# Patient Record
Sex: Female | Born: 1975 | Race: Black or African American | Hispanic: No | Marital: Married | Smoking: Never smoker
Health system: Southern US, Community
[De-identification: ages and names within clinical notes are randomized; demographics above are authoritative.]

## PROBLEM LIST (undated history)

## (undated) DIAGNOSIS — IMO0002 Reserved for concepts with insufficient information to code with codable children: Secondary | ICD-10-CM

## (undated) DIAGNOSIS — J45909 Unspecified asthma, uncomplicated: Secondary | ICD-10-CM

## (undated) DIAGNOSIS — R51 Headache: Secondary | ICD-10-CM

## (undated) HISTORY — DX: Reserved for concepts with insufficient information to code with codable children: IMO0002

## (undated) HISTORY — DX: Unspecified asthma, uncomplicated: J45.909

---

## 2000-06-20 ENCOUNTER — Other Ambulatory Visit: Admission: RE | Admit: 2000-06-20 | Discharge: 2000-06-20 | Payer: Self-pay | Admitting: Obstetrics

## 2000-06-25 ENCOUNTER — Inpatient Hospital Stay (HOSPITAL_COMMUNITY): Admission: AD | Admit: 2000-06-25 | Discharge: 2000-06-25 | Payer: Self-pay | Admitting: Obstetrics

## 2000-06-25 ENCOUNTER — Encounter: Payer: Self-pay | Admitting: Obstetrics

## 2000-12-08 ENCOUNTER — Inpatient Hospital Stay (HOSPITAL_COMMUNITY): Admission: AD | Admit: 2000-12-08 | Discharge: 2000-12-10 | Payer: Self-pay | Admitting: Obstetrics

## 2002-01-31 ENCOUNTER — Inpatient Hospital Stay (HOSPITAL_COMMUNITY): Admission: AD | Admit: 2002-01-31 | Discharge: 2002-02-02 | Payer: Self-pay | Admitting: Obstetrics

## 2013-02-10 ENCOUNTER — Ambulatory Visit (INDEPENDENT_AMBULATORY_CARE_PROVIDER_SITE_OTHER): Payer: BC Managed Care – HMO | Admitting: Physician Assistant

## 2013-02-10 VITALS — BP 126/78 | HR 67 | Temp 97.7°F | Resp 16 | Ht 65.0 in | Wt 188.0 lb

## 2013-02-10 DIAGNOSIS — R109 Unspecified abdominal pain: Secondary | ICD-10-CM

## 2013-02-10 DIAGNOSIS — N926 Irregular menstruation, unspecified: Secondary | ICD-10-CM

## 2013-02-10 LAB — POCT CBC
Granulocyte percent: 47.1 %G (ref 37–80)
HCT, POC: 36.1 % — AB (ref 37.7–47.9)
Hemoglobin: 11 g/dL — AB (ref 12.2–16.2)
Lymph, poc: 3.2 (ref 0.6–3.4)
MCH, POC: 24.2 pg — AB (ref 27–31.2)
MCHC: 30.5 g/dL — AB (ref 31.8–35.4)
MCV: 79.3 fL — AB (ref 80–97)
MID (cbc): 0.6 (ref 0–0.9)
MPV: 9.3 fL (ref 0–99.8)
POC Granulocyte: 3.4 (ref 2–6.9)
POC LYMPH PERCENT: 44.6 %L (ref 10–50)
POC MID %: 8.3 %M (ref 0–12)
Platelet Count, POC: 219 10*3/uL (ref 142–424)
RBC: 4.55 M/uL (ref 4.04–5.48)
RDW, POC: 17.5 %
WBC: 7.2 10*3/uL (ref 4.6–10.2)

## 2013-02-10 LAB — POCT UA - MICROSCOPIC ONLY
Casts, Ur, LPF, POC: NEGATIVE
Crystals, Ur, HPF, POC: NEGATIVE
Yeast, UA: NEGATIVE

## 2013-02-10 LAB — POCT URINALYSIS DIPSTICK
Bilirubin, UA: NEGATIVE
Glucose, UA: NEGATIVE
Ketones, UA: NEGATIVE
Leukocytes, UA: NEGATIVE
Nitrite, UA: NEGATIVE
Protein, UA: NEGATIVE
Spec Grav, UA: >=1.03
Urobilinogen, UA: 0.2
pH, UA: 5

## 2013-02-10 LAB — POCT URINE PREGNANCY: Preg Test, Ur: NEGATIVE

## 2013-02-10 NOTE — Progress Notes (Signed)
  Subjective:    Patient ID: Anna Hudson, female    DOB: [birth year], 37 y.o.   MRN: [id number]  HPI 37 year old female presents today with her husband who is translating for her.  She complains of lower abdominal pain x 2 years. States symptoms started after having surgery after a miscarriage.  Admits pain is cyclical and typically comes after her menstrual cycle.  Has taken OTC ibuprofen which does seem to help.  Does believe that the pain is worse this month and that is why she presented today.  LNMP 01/30/13. Denies menorrhagia.  No dysuria, frequency, diarrhea, constipation, hematuria, hematochezia, melena, nausea, vomiting, fever, or chills.  She would like to become pregnant but is afraid due to this pain.      Review of Systems  Constitutional: Negative for fever and chills.  Gastrointestinal: Positive for abdominal pain. Negative for nausea, vomiting, diarrhea, constipation and blood in stool.  Genitourinary: Negative for dysuria, frequency, vaginal bleeding, vaginal discharge and vaginal pain.  Neurological: Negative for dizziness and headaches.       Objective:   Physical Exam  Constitutional: She is oriented to person, place, and time. She appears well-developed and well-nourished.  HENT:  Head: Normocephalic and atraumatic.  Right Ear: External ear normal.  Left Ear: External ear normal.  Eyes: Conjunctivae are normal.  Neck: Normal range of motion.  Cardiovascular: Normal rate, regular rhythm and normal heart sounds.   Pulmonary/Chest: Effort normal and breath sounds normal.  Abdominal: There is no CVA tenderness.    Neurological: She is alert and oriented to person, place, and time.  Psychiatric: She has a normal mood and affect. Her behavior is normal. Judgment and thought content normal.          Assessment & Plan:  Irregular menstrual cycle - Plan: POCT urine pregnancy, Ambulatory referral to Obstetrics / Gynecology  Abdominal  pain, other specified site  - Plan: POCT CBC, POCT urinalysis dipstick, POCT UA - Microscopic Only, Ambulatory referral to Obstetrics / Gynecology  Referral to gynecology for further evaluation and treatment In the meantime, take OTC ibuprofen prn pain as this does seem to help.  Due to cyclical nature of this pain, I do believe that is is likely dysmenorrhea, however further evaluation with [place] is warranted

## 2013-03-26 ENCOUNTER — Ambulatory Visit (INDEPENDENT_AMBULATORY_CARE_PROVIDER_SITE_OTHER): Payer: BC Managed Care – PPO | Admitting: Obstetrics & Gynecology

## 2013-03-26 ENCOUNTER — Encounter: Payer: Self-pay | Admitting: Obstetrics & Gynecology

## 2013-03-26 VITALS — BP 121/85 | HR 76 | Temp 98.0°F | Ht 65.0 in | Wt 192.3 lb

## 2013-03-26 DIAGNOSIS — N979 Female infertility, unspecified: Secondary | ICD-10-CM

## 2013-03-26 DIAGNOSIS — N949 Unspecified condition associated with female genital organs and menstrual cycle: Secondary | ICD-10-CM

## 2013-03-26 DIAGNOSIS — N926 Irregular menstruation, unspecified: Secondary | ICD-10-CM

## 2013-03-26 DIAGNOSIS — G8929 Other chronic pain: Secondary | ICD-10-CM

## 2013-03-26 LAB — CBC
HCT: 35 % — ABNORMAL LOW (ref 36.0–46.0)
Hemoglobin: 11.5 g/dL — ABNORMAL LOW (ref 12.0–15.0)
MCH: 24.4 pg — ABNORMAL LOW (ref 26.0–34.0)
MCHC: 32.9 g/dL (ref 30.0–36.0)
MCV: 74.2 fL — ABNORMAL LOW (ref 78.0–100.0)

## 2013-03-26 MED ORDER — DICLOFENAC SODIUM 75 MG PO TBEC: 75.0000 mg | DELAYED_RELEASE_TABLET | Freq: Two times a day (BID) | ORAL | Status: DC | PRN

## 2013-03-26 NOTE — Patient Instructions (Signed)

## 2013-03-26 NOTE — Progress Notes (Signed)
GYNECOLOGY CLINIC PROGRESS NOTE  History:  37 y.o. [id number] here today for evaluation of irregular menses.  She is from [place] and [place], declines the use of a medical interpreter as she wants her husband to interpret for her.  Reports irregular menses for past three months; period length is now 2- 8 days as opposed to 5 days.  No intermenstrual bleeding.  Reports having heavier bleeding than usual in the first few days of her cycle, uses 5 pads/day for these days.  Also reports chronic pelvic pain; this pain has been ongoing since her cesarean section in 2012 for a 6 month IUFD.  Pain is episodic but has gotten worse in the last three months with her irregular menstrual periods.  Patient is also worried about secondary infertility.   The following portions of the patient's history were reviewed and updated as appropriate: allergies, current medications, past family history, past medical history, past social history, past surgical history and problem list.  Review of Systems:  Pertinent items are noted in HPI.  Objective:  Physical Exam BP 121/85  Pulse 76  Temp(Src) 98 F (36.7 C)  Ht 5\' 5"  (1.651 m)  Wt 192 lb 4.8 oz (87.227 kg)  BMI 32 kg/m2  LMP 03/25/2013 Deferred as per patient request  Assessment & Plan:   Will check labs for evaluation of irregular menses, will also order pelvic ultrasound; will follow up results and manage accordingly. Patient needs to sign the refusal of interpreter form when she comes back to discuss results.

## 2013-03-27 ENCOUNTER — Telehealth: Payer: Self-pay | Admitting: *Deleted

## 2013-03-27 LAB — PROLACTIN: Prolactin: 7.7 ng/mL

## 2013-03-27 LAB — FOLLICLE STIMULATING HORMONE: FSH: 5.4 m[IU]/mL

## 2013-03-27 LAB — HCG, QUANTITATIVE, PREGNANCY: hCG, Beta Chain, Quant, S: <2 m[IU]/mL

## 2013-03-27 NOTE — Telephone Encounter (Signed)
Message copied by Jill Side on Tue Mar 27, 2013  3:49 PM ------      Message from: Jaynie Collins A      Created: Tue Mar 27, 2013  8:29 AM       Normal labs, patient is not anemic.  Will folllow up ultrasound.  Please call to inform patient of results.       ------

## 2013-03-30 ENCOUNTER — Ambulatory Visit (HOSPITAL_COMMUNITY)
Admission: RE | Admit: 2013-03-30 | Discharge: 2013-03-30 | Disposition: A | Discharge status: Home / Self Care | Payer: BC Managed Care – PPO | Source: Ambulatory Visit | Attending: Obstetrics & Gynecology | Admitting: Obstetrics & Gynecology

## 2013-03-30 DIAGNOSIS — N949 Unspecified condition associated with female genital organs and menstrual cycle: Secondary | ICD-10-CM | POA: Insufficient documentation

## 2013-03-30 DIAGNOSIS — N926 Irregular menstruation, unspecified: Secondary | ICD-10-CM | POA: Insufficient documentation

## 2013-04-03 NOTE — Telephone Encounter (Signed)
Called pt with [employer] # [phone] and left message that her labs are normal and if she has any questions to please give [place] a call here at the clinics.

## 2013-04-26 ENCOUNTER — Telehealth: Payer: Self-pay | Admitting: *Deleted

## 2013-04-26 NOTE — Telephone Encounter (Signed)
Speaks [place] per chart.

## 2013-04-26 NOTE — Telephone Encounter (Signed)
Received a voice mail from a female stating [place] wanting to get ultrasound results from last month.

## 2013-05-01 NOTE — Telephone Encounter (Signed)
Called patient with [employer] [phone], no answer- left message that we are returning her phone call and to call us back at the clinics. Patient needs to follow up with Dr Macon Large. Has appt for 9/24 @ 12:45- patient needs to be informed

## 2013-05-02 ENCOUNTER — Telehealth: Payer: Self-pay | Admitting: *Deleted

## 2013-05-02 NOTE — Telephone Encounter (Signed)
Received message from a female caller that he is calling for pt's test results. He stated to call back at 1200 or 1230 and no interpreter is needed.

## 2013-05-02 NOTE — Telephone Encounter (Signed)
Erroneous encounter

## 2013-05-04 ENCOUNTER — Telehealth: Payer: Self-pay | Admitting: General Practice

## 2013-05-04 NOTE — Telephone Encounter (Signed)
Patient called and left message stating she wanted results and to call back.

## 2013-05-09 NOTE — Telephone Encounter (Signed)
See other call from this patient in same time period.

## 2013-05-09 NOTE — Telephone Encounter (Signed)
Called only phone number listed with [employer] and left a message we are returning your call- please call back if you still have questions.  Also left message stating you do have an appointment 06/06/13 .

## 2013-06-06 ENCOUNTER — Ambulatory Visit (INDEPENDENT_AMBULATORY_CARE_PROVIDER_SITE_OTHER): Payer: BC Managed Care – PPO | Admitting: Obstetrics & Gynecology

## 2013-06-06 VITALS — BP 117/79 | HR 62 | Temp 99.0°F | Wt 195.0 lb

## 2013-06-06 DIAGNOSIS — G8929 Other chronic pain: Secondary | ICD-10-CM

## 2013-06-06 DIAGNOSIS — N949 Unspecified condition associated with female genital organs and menstrual cycle: Secondary | ICD-10-CM

## 2013-06-06 DIAGNOSIS — N979 Female infertility, unspecified: Secondary | ICD-10-CM

## 2013-06-06 MED ORDER — DICLOFENAC SODIUM 75 MG PO TBEC: 75.0000 mg | DELAYED_RELEASE_TABLET | Freq: Two times a day (BID) | ORAL | Status: DC | PRN

## 2013-06-06 MED ORDER — CLOMIPHENE CITRATE 50 MG PO TABS: 50.0000 mg | ORAL_TABLET | Freq: Every day | ORAL | Status: DC

## 2013-06-06 NOTE — Patient Instructions (Addendum)
CLOMID PATIENT INSTRUCTIONS  WHY USE IT? Clomid helps your ovaries to release eggs (ovulate).  HOW TO USE IT? Clomid is taken as a pill usually on days 1, 2, 3, 4, and 5 of your cycle.  Day 1 is the first day of your period. The dose or duration may be changed to achieve ovulation.  The day of ovulation on Clomid is usually between cycle day 14 and 17.  Having sexual intercourse at least every other day between cycle day 13 and 18 will improve your chances of becoming pregnant during the Clomid cycle.  You may monitor your ovulation using basal body temperature charts or with ovulation kits.  If using the ovulation predictor kits, having intercourse the day of the surge and the two days following is recommended. If you get your period, call when it starts for an appointment with your doctor, so that an exam may be done, and another Clomid cycle can be considered if appropriate. If you do not get a period by day 35 of the cycle, please get a blood pregnancy test.  If it is negative, speak to your doctor for instructions to bring on another period and to plan a follow-up appointment.  THINGS TO KNOW: If you get pregnant while using Clomid, your chance of twins is 7% and triplets is less than 1%. Some studies have suggested the use of "fertility drugs" may increase your risk of ovarian cancers in the future.  It is unclear if these drugs increase the risk, or people who have problems with fertility are prone for these cancers.  If there is an actual risk, it is very low.  If you have a history of liver problems or ovarian cancer, it may be wise to avoid this medication.  SIDE EFFECTS:  The most common side effect is hot flashes (20%).  Breast tenderness, headaches, nausea, bloating may also occur at different times.  Less than 3/1,000 people have dryness or loss of hair.  Persistent ovarian cysts may form from the use of this medication.  Ovarian hyperstimulation syndrome is a rare side effect at  low doses.  Visual changes like flashes of light or blurring.

## 2013-06-07 ENCOUNTER — Encounter: Payer: Self-pay | Admitting: Obstetrics & Gynecology

## 2013-06-07 ENCOUNTER — Encounter: Payer: Self-pay | Admitting: *Deleted

## 2013-06-07 NOTE — Progress Notes (Signed)
GYNECOLOGY CLINIC ENCOUNTER NOTE  History:  37 y.o. [id number] here today for discussion of results after evaluation for abnormal menses and secondary infertility.  Her husband is her interpreter, she signed a form declining any other interpreter. She also reports having pain around her cesarean incision site.  No other concerns.  The following portions of the patient's history were reviewed and updated as appropriate: allergies, current medications, past family history, past medical history, past social history, past surgical history and problem list.  Review of Systems:  Pertinent items are noted in HPI.  Objective:  Physical Exam BP 117/79  Pulse 62  Temp(Src) 99 F (37.2 C) (Oral)  Wt 195 lb (88.451 kg)  BMI 32.45 kg/m2 Gen: NAD Abd: Soft, nondistended, nontender except for a 1 cm tendersubcutaneous mass palpated in the mid right section of the incision.   Pelvic: Deferred  Labs and Imaging Recent Results (from the past 2160 hour(s))  PROLACTIN     Status: None   Collection Time    03/26/13  1:59 PM      Result Value Range   Prolactin 7.7     Comment:      Reference Ranges:                      Female:                       2.1 -  17.1 ng/ml                      Female:   Pregnant          9.7 - 208.5 ng/mL                                Non Pregnant      2.8 -  29.2 ng/mL                                Post Menopausal   1.8 -  20.3 ng/mL                         TESTOSTERONE, FREE, TOTAL     Status: Abnormal   Collection Time    03/26/13  1:59 PM      Result Value Range   Testosterone 66  10 - 70 ng/dL   Comment:           Tanner Stage       Female              Female                   I              < 30 ng/dL        < 10 ng/dL                   II             < 150 ng/dL       < 30 ng/dL                   III            100-320 ng/dL     < 35 ng/dL  IV             200-970 ng/dL     16-10 ng/dL                   V/Adult        300-890 ng/dL     96-04 ng/dL          Sex Hormone Binding 47  18 - 114 nmol/L   Testosterone, Free 9.6 (*) 0.6 - 6.8 pg/mL   Comment:       The concentration of free testosterone is derived from a mathematical     expression based on constants for the binding of testosterone to sex     hormone-binding globulin and albumin.   Testosterone-% Freee. 1.4  0.4 - 2.4 %  TSH     Status: None   Collection Time    03/26/13  1:59 PM      Result Value Range   TSH 1.369  0.350 - 4.500 uIU/mL  FOLLICLE STIMULATING HORMONE     Status: None   Collection Time    03/26/13  1:59 PM      Result Value Range   FSH 5.4     Comment: Reference Ranges:              Female:                         1.4 -  18.1 mIU/mL              Female:   Follicular Phase    2.5 -  10.2 mIU/mL                        MidCycle Peak       3.4 -  33.4 mIU/mL                        Luteal Phase        1.5 -   9.1 mIU/mL                        Post Menopausal    23.0 - 116.3 mIU/mL                        Pregnant                <   0.3 mIU/mL  HCG, QUANTITATIVE, PREGNANCY     Status: None   Collection Time    03/26/13  1:59 PM      Result Value Range   hCG, Beta Chain, Quant, S <2.0     Comment:        Males and non-pregnant females       < 5   mIU/mL      Gestation Age               Concentration (mIU/mL)        <= 1 Week                       5 - 50           2 Weeks                     50 - 500           3 Weeks  100 - 10,000           4 Weeks                  1,000 - 30,000           5 Weeks                  3,500 - 115,000         6-8 Weeks                 12,000 - 270,000          12 Weeks                 15,000 - 220,000  CBC     Status: Abnormal   Collection Time    03/26/13  1:59 PM      Result Value Range   WBC 5.1  4.0 - 10.5 K/uL   RBC 4.72  3.87 - 5.11 MIL/uL   Hemoglobin 11.5 (*) 12.0 - 15.0 g/dL   HCT 40.9 (*) 81.1 - 91.4 %   MCV 74.2 (*) 78.0 - 100.0 fL   MCH 24.4 (*) 26.0 - 34.0 pg   MCHC 32.9  30.0 - 36.0 g/dL    RDW 78.2 (*) 95.6 - 15.5 %   Platelets 333  150 - 400 K/uL    03/30/2013   TRANSABDOMINAL AND TRANSVAGINAL ULTRASOUND OF PELVIS Clinical Data: Irregular menses.  Prior C-section.  LMP 03/21/2013   Technique:  Both transabdominal and transvaginal ultrasound examinations of the pelvis were performed. Transabdominal technique was performed for global imaging of the pelvis including uterus, ovaries, adnexal regions, and pelvic cul-de-sac.  It was necessary to proceed with endovaginal exam following the transabdominal exam to visualize the myometrium, endometrium and adnexa.  Comparison:  None  Findings:  Uterus: Is anteverted and anteflexed and demonstrates a sagittal length of 10.5 cm, depth of 5.6 cm and width of 6.9 cm.  No focal mural abnormality is seen.  Endometrium: Tri-layered pattern with a width of 12 mm.  This would correlate with a periovulatory endometrium and correspond with the provided LMP of 03/21/2013.  No areas of focal thickening or heterogeneity are seen  Right ovary:  Has a normal appearance measuring 2.3 by 2.9 x 1.6 cm  Left ovary: Measures 3.2 x 2.5 x 3.3 cm and contains a dominant follicle  Other findings: A trace of simple free fluid is noted in the cul-de- sac.  The patient reported an area of focal tenderness in the region of the C-section scar and scanning in the subcutaneous tissue over the location of tenderness reveals a hypoechoic irregular soft tissue mass measuring 1.1 x 1.1 x 1.2 cm.  This demonstrates no significant intralesional flow with color Doppler exam.  IMPRESSION: Normal periovulatory uterine myometrium, endometrium and ovaries.  Irregular hypoechoic soft tissue mass corresponding with the patient's area of tenderness in the region of the C-section scar. The appearance raises the possibility of endometriosis implant within the C-section scar especially in light of the history of associated pain.  A desmoid tumor or area of focal scarring could have a similar  appearance.  This area would be amendable to percutaneous biopsy.   Original Report Authenticated By: Rhodia Albright, M.D.    Assessment & Plan:  Results reviewed with patient.  Labs are remarkable for slightly increased free testosterone, but patient has monthly periods that are only "irregular" because the length of the period went from 5-7  days to three days.  She still has normal intervals between cycles, no intermenstrual bleeding.  No other signs of hyperandrogenism.  Patient was told that she could benefit from [facility] referral for further evaluation of her infertility; she needs HSG, husband needs semen analysis etc. Patient does not want to do this for now and is interested in taking medication to help her "get pregnant", she was referring to Clomid.  The risks of Clomid including ovarian hyperstimulation with possible risk of ovarian cancer as well as multiple gestation were discussed with patient.  She and her husband understand these risks and wanted the medication; she was prescribed Clomid 50 mcg and advised to take it during days 1-5 of her cycle.  Patient was also advised to continue frequent intercourse especially around Day 14 of her upcoming cycle (qod intercourse around days 9 - 18).  By Day 35, patient was told to call if she had her period.  If patient has bleeding at end of cycle, will try one cycle with Clomid 100 mcg but if no pregnancy occurs, she will need REI referral.  She declines outright REI referral for now.   However if patient does not have bleeding, she was told to do a pregnancy test/come in for evaluation.  Patient instructions and information was given to her.  As for her incisional pain, she was offered an injection of lidocaine into the mass area to see if this helps.  She declines this, also declines biopsy/mass resection for now.  Will continue to monitor. Diclofenac reordered for her to use prn pain.  Total encounter time: 25 minutes  Jaynie Collins, MD,  FACOG Attending Obstetrician & Gynecologist Faculty Practice, [facility]

## 2013-07-23 ENCOUNTER — Ambulatory Visit (INDEPENDENT_AMBULATORY_CARE_PROVIDER_SITE_OTHER): Payer: BC Managed Care – HMO | Admitting: Emergency Medicine

## 2013-07-23 VITALS — BP 114/80 | HR 85 | Temp 98.2°F | Resp 16 | Ht 65.25 in | Wt 199.4 lb

## 2013-07-23 DIAGNOSIS — R109 Unspecified abdominal pain: Secondary | ICD-10-CM

## 2013-07-23 DIAGNOSIS — N939 Abnormal uterine and vaginal bleeding, unspecified: Secondary | ICD-10-CM

## 2013-07-23 DIAGNOSIS — R9389 Abnormal findings on diagnostic imaging of other specified body structures: Secondary | ICD-10-CM

## 2013-07-23 DIAGNOSIS — R1030 Lower abdominal pain, unspecified: Secondary | ICD-10-CM

## 2013-07-23 DIAGNOSIS — N898 Other specified noninflammatory disorders of vagina: Secondary | ICD-10-CM

## 2013-07-23 LAB — POCT CBC
Granulocyte percent: 54 %G (ref 37–80)
HCT, POC: 40.4 % (ref 37.7–47.9)
Hemoglobin: 12.3 g/dL (ref 12.2–16.2)
Lymph, poc: 2.3 (ref 0.6–3.4)
MCH, POC: 24.8 pg — AB (ref 27–31.2)
MCHC: 30.4 g/dL — AB (ref 31.8–35.4)
MCV: 81.6 fL (ref 80–97)
MPV: 9.9 fL (ref 0–99.8)
POC Granulocyte: 3.2 (ref 2–6.9)
POC LYMPH PERCENT: 38.6 %L (ref 10–50)
POC MID %: 7.4 %M (ref 0–12)
RDW, POC: 16.4 %
WBC: 6 10*3/uL (ref 4.6–10.2)

## 2013-07-23 LAB — POCT URINALYSIS DIPSTICK
Bilirubin, UA: NEGATIVE
Glucose, UA: NEGATIVE
Leukocytes, UA: NEGATIVE
Nitrite, UA: NEGATIVE
Urobilinogen, UA: 0.2

## 2013-07-23 LAB — POCT UA - MICROSCOPIC ONLY
Casts, Ur, LPF, POC: NEGATIVE
Mucus, UA: POSITIVE
WBC, Ur, HPF, POC: NEGATIVE
Yeast, UA: NEGATIVE

## 2013-07-23 LAB — POCT URINE PREGNANCY: Preg Test, Ur: NEGATIVE

## 2013-07-23 MED ORDER — MELOXICAM 7.5 MG PO TABS: 7.5000 mg | ORAL_TABLET | Freq: Every day | ORAL | Status: DC

## 2013-07-23 NOTE — Progress Notes (Addendum)
Subjective:    Patient ID: Anna Hudson, female    DOB: November 21, 1975, 37 y.o.   MRN: [id number] This chart was scribed for Collene Gobble, MD by Valera Castle, ED Scribe. This patient was seen in room 9 and the patient's care was started at 11:34 AM.  HPI Anna Hudson is a 37 y.o. female who presents to the [facility] complaining of sudden, moderate abdominal pain, onset yesterday, with an associated irregular period, noticing dark blood. She reports her LNMP was 2 months ago in 05/2013. She reports a h/o pregnancy with her last in 2012, with a c-section. She states she has 5 children. She reports a miscarriage in 2012.   She denies having a UA at her last visit at [facility]. She states she was last seen at [facility]. She had an US done in 03/2013.  She denies any other complaints.    Patient Active Problem List   Diagnosis Date Noted  . Irregular menstrual bleeding 03/26/2013  . 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 Asthma   . Ulcer    History reviewed. No pertinent past surgical history. No Known Allergies Prior to Admission medications   Medication Sig Start Date End Date Taking? Authorizing Provider  acetaminophen (TYLENOL) 325 MG tablet Take 650 mg by mouth every 6 (six) hours as needed for pain.   Yes Historical Provider, MD  clomiPHENE (CLOMID) 50 MG tablet Take 1 tablet (50 mg total) by mouth daily. Take on days 1-5 of your period 06/06/13  Yes Tereso Newcomer, MD  diclofenac (VOLTAREN) 75 MG EC tablet Take 1 tablet (75 mg total) by mouth 2 (two) times daily as needed. 06/06/13  Yes Tereso Newcomer, MD    Review of Systems  Gastrointestinal: Positive for abdominal pain.  Genitourinary: Positive for menstrual problem (dark blood, irregular period).      Objective:   Physical Exam Nursing note and vitals reviewed. Constitutional: She is oriented to person, place, and time. She  appears well-developed and well-nourished. No distress.  HENT:  Head: Normocephalic and atraumatic.  Eyes: EOM are normal. Pupils are equal, round, and reactive to light.  Neck: Neck supple. No tracheal deviation present.  Cardiovascular: Normal rate, regular rhythm and normal heart sounds.  Exam reveals no gallop and no friction rub.   No murmur heard. Pulmonary/Chest: Effort normal and breath sounds normal. No respiratory distress. She has no wheezes. She has no rales.  Abdominal: Soft. Bowel sounds are normal. There is no tenderness. There is no rebound and no guarding.  Musculoskeletal: Normal range of motion.  Neurological: She is alert and oriented to person, place, and time.  Skin: Skin is warm and dry.  Psychiatric: She has a normal mood and affect. Her behavior is normal.  Results for orders placed in visit on 07/23/13  POCT URINE PREGNANCY      Result Value Range   Preg Test, Ur Negative    POCT CBC      Result Value Range   WBC 6.0  4.6 - 10.2 Hudson/uL   Lymph, poc 2.3  0.6 - 3.4   POC LYMPH PERCENT 38.6  10 - 50 %L   MID (cbc) 0.4  0 - 0.9   POC MID % 7.4  0 - 12 %M   POC Granulocyte 3.2  2 - 6.9   Granulocyte percent 54.0  37 - 80 %G   RBC  4.95  4.04 - 5.48 M/uL   Hemoglobin 12.3  12.2 - 16.2 g/dL   HCT, POC 53.6  64.4 - 47.9 %   MCV 81.6  80 - 97 fL   MCH, POC 24.8 (*) 27 - 31.2 pg   MCHC 30.4 (*) 31.8 - 35.4 g/dL   RDW, POC 03.4     Platelet Count, POC 218.0  142 - 424 Hudson/uL   MPV 9.9  0 - 99.8 fL   Results for orders placed in visit on 07/23/13  POCT URINE PREGNANCY      Result Value Range   Preg Test, Ur Negative    POCT CBC      Result Value Range   WBC 6.0  4.6 - 10.2 Hudson/uL   Lymph, poc 2.3  0.6 - 3.4   POC LYMPH PERCENT 38.6  10 - 50 %L   MID (cbc) 0.4  0 - 0.9   POC MID % 7.4  0 - 12 %M   POC Granulocyte 3.2  2 - 6.9   Granulocyte percent 54.0  37 - 80 %G   RBC 4.95  4.04 - 5.48 M/uL   Hemoglobin 12.3  12.2 - 16.2 g/dL   HCT, POC 74.2  59.5 - 47.9 %    MCV 81.6  80 - 97 fL   MCH, POC 24.8 (*) 27 - 31.2 pg   MCHC 30.4 (*) 31.8 - 35.4 g/dL   RDW, POC 63.8     Platelet Count, POC 218.0  142 - 424 Hudson/uL   MPV 9.9  0 - 99.8 fL  POCT UA - MICROSCOPIC ONLY      Result Value Range   WBC, Ur, HPF, POC negative     RBC, urine, microscopic TNTC     Bacteria, U Microscopic 3+     Mucus, UA positive     Epithelial cells, urine per micros 0-2     Crystals, Ur, HPF, POC negative     Casts, Ur, LPF, POC negative     Yeast, UA negative    POCT URINALYSIS DIPSTICK      Result Value Range   Color, UA yellow     Clarity, UA clear     Glucose, UA negative     Bilirubin, UA negative     Ketones, UA negative     Spec Grav, UA 1.025     Blood, UA large     pH, UA 5.5     Protein, UA negative     Urobilinogen, UA 0.2     Nitrite, UA negative     Leukocytes, UA Negative      Triage Vitals: BP 114/80  Pulse 85  Temp(Src) 98.2 F (36.8 C) (Oral)  Resp 16  Ht 5' 5.25" (1.657 m)  Wt 199 lb 6.4 oz (90.447 kg)  BMI 32.94 kg/m2  SpO2 99%  LMP 06/22/2013     Assessment & Plan:   Patient is not pregnant. Urine culture was requested. She's placed on Mobic 7.5 one a day. Referral made back to her OB/GYN doctor. There is an abnormal  area a little greater than 1 cm seen on her ultrasound. This needs to be followed up with her GYN physician to reevaluate. She did have blood in her urine but I suspect this is due to non-clean catch since she is on her menses a urine culture was done. She was told to stop her diclofenac     I personally performed the services described in  this documentation, which was scribed in my presence. The recorded information has been reviewed and is accurate.

## 2013-07-23 NOTE — Patient Instructions (Signed)
Please be sure you make an appointment to see Dr.Anyanwu to followup on the abnormal areas seen on your ultrasound

## 2013-07-23 NOTE — Progress Notes (Deleted)
  Subjective:    Patient ID: Anna Hudson, female    DOB: 06-Dec-1975, 37 y.o.   MRN: [id number]  HPI    Review of Systems     Objective:   Physical Exam        Assessment & Plan:

## 2013-07-23 NOTE — Progress Notes (Signed)
Pelvic exam performed at the request of Dr. Cleta Alberts. Normal female external genitalia without lesion.  No inguinal lymphadenopathy or lesions. Vaginal vault with moderate blood, consistent with current menstruation. Cervix is multiparous with closed os. No cervical motion tenderness, uterine fullness or adnexal fullness. Tenderness noted in the suprapubic region and RIGHT adnexal region, most prominent centrally.

## 2013-07-24 LAB — URINE CULTURE: Culture: >=100000

## 2013-07-24 LAB — HCG, QUANTITATIVE, PREGNANCY: hCG, Beta Chain, Quant, S: <2 m[IU]/mL

## 2013-07-27 ENCOUNTER — Telehealth: Payer: Self-pay

## 2013-07-27 MED ORDER — CEPHALEXIN 500 MG PO CAPS: 500.0000 mg | ORAL_CAPSULE | Freq: Three times a day (TID) | ORAL | Status: DC

## 2013-07-27 NOTE — Telephone Encounter (Signed)
Notes Recorded by Collene Gobble, MD on 07/26/2013 at 7:19 AM Call the husband and let him know patient's urine culture is positive for Klebsiella. Call in cephalexin 500 mg 3 times a day for one week. #21 tablets Called/ he is advised meds sent in.

## 2013-07-27 NOTE — Telephone Encounter (Signed)
Called the husband and let him know it appears his wife has a urinary tract infection. I should have the results of her urine culture back tomorrow and I will call in and an antibiotic.

## 2013-07-27 NOTE — Telephone Encounter (Signed)
Pt husband is calling because they had missed a phone call from the lab about lab results.  Call back number is [phone] he is on the HIPPA form

## 2013-08-08 ENCOUNTER — Encounter: Payer: Self-pay | Admitting: Obstetrics & Gynecology

## 2013-08-08 ENCOUNTER — Encounter: Payer: Self-pay | Admitting: *Deleted

## 2013-08-08 ENCOUNTER — Ambulatory Visit (INDEPENDENT_AMBULATORY_CARE_PROVIDER_SITE_OTHER): Payer: BC Managed Care – PPO | Admitting: Obstetrics & Gynecology

## 2013-08-08 VITALS — BP 107/69 | HR 77 | Temp 98.4°F | Wt 200.6 lb

## 2013-08-08 DIAGNOSIS — N926 Irregular menstruation, unspecified: Secondary | ICD-10-CM

## 2013-08-08 DIAGNOSIS — G8929 Other chronic pain: Secondary | ICD-10-CM

## 2013-08-08 DIAGNOSIS — N949 Unspecified condition associated with female genital organs and menstrual cycle: Secondary | ICD-10-CM

## 2013-08-08 DIAGNOSIS — N644 Mastodynia: Secondary | ICD-10-CM

## 2013-08-08 DIAGNOSIS — Z23 Encounter for immunization: Secondary | ICD-10-CM

## 2013-08-08 NOTE — Progress Notes (Signed)
GYNECOLOGY CLINIC ENCOUNTER NOTE  History:  37 y.o. [id number] here today for report of worsening pelvic and lower abdominal pain.  She was last seen here for pelvic pain in July 2014.  She is from [place] and speaks [place], she declines the use of a medical interpreter as she wants her husband to interpret for her.  She signed a Release of Responsibility for Interpretation form which is scanned and is under the Media tab.  Patient was diagnosed with a cesarean scar endometriotic implant as per her ultrasound on 04/01/13.  Patient had previously declined Lidocaine injection into this site and/or surgical removal.  Today, she reports that she continues to have the pain at this site, also reports having pain in lower abdomen.  She has been recently diagnosed with a UTI and has finished the antibiotic regimen.  Patient described the lower abdominal pain as "something grabbing her on the inside".  She feels that this is a result of her previous cesarean section, feels that something else was done during that surgery in [place].  She cannot obtain the operative report.  She wants surgery to evaluate her for any anomalies that could be causing her pain.  Also desires a pelvic ultrasound, she is convinced something is wrong with her ovaries.  Of note, patient also reported noting that her breasts are bigger and are more uncomfortable.  No masses, skin changes or nipple drainage.  She wants them examined, does not want any imaging. No association with menstrual cycles.  Patient also reports that in the last three months her periods have consistently come three days late and only last one day.  She had multiple negative home UPTs.  No intermenstrual bleeding.  Patient was prescribed Clomid for secondary infertility last visit as per her preference, she reports never filling the prescription as she feels her doctor in [place] told her she should not try to be pregnant again after her cesarean section for 6 month IUFD.  The  following portions of the patient's history were reviewed and updated as appropriate: allergies, current medications, past family history, past medical history, past social history, past surgical history and problem list.    Review of Systems:  Pertinent items are noted in HPI.  Objective:  Physical Exam BP 107/69  Pulse 77  Temp(Src) 98.4 F (36.9 C) (Oral)  Wt 200 lb 9.6 oz (90.992 kg)  LMP 07/21/2013 Gen: NAD Breasts: Soft, symmetric in size, no masses, no skin changes, no nipple drainage, no lymphadenopathy. Abd: Soft,tenderness over her cesarean section lesion, diffuse lower abdominal tenderness, no laterality noted Pelvic: Deferred as per patient preference  Labs and Imaging 04/01/13  TRANSABDOMINAL AND TRANSVAGINAL ULTRASOUND OF PELVIS Clinical Data: Irregular menses. Prior C-section. LMP 03/21/2013 Comparison: None  Findings: Uterus: Is anteverted and anteflexed and demonstrates a sagittal length of 10.5 cm, depth of 5.6 cm and width of 6.9 cm. No focal mural abnormality is seen.  Endometrium: Tri-layered pattern with a width of 12 mm. This would correlate with a periovulatory endometrium and correspond with the provided LMP of 03/21/2013. No areas of focal thickening or heterogeneity are seen  Right ovary: Has a normal appearance measuring 2.3 by 2.9 x 1.6 cm. Left ovary: Measures 3.2 x 2.5 x 3.3 cm and contains a dominant follicle.  Other findings: A trace of simple free fluid is noted in the cul-de-sac.  The patient reported an area of focal tenderness in the region of the C-section scar and scanning in the subcutaneous tissue over the location  of tenderness reveals a hypoechoic irregular soft tissue mass measuring 1.1 x 1.1 x 1.2 cm. This demonstrates no significant intralesional flow with color Doppler exam.  IMPRESSION: Normal periovulatory uterine myometrium, endometrium and ovaries.  Irregular hypoechoic soft tissue mass corresponding with the patient's area of tenderness in  the region of the C-section scar.  The appearance raises the possibility of endometriosis implant within the C-section scar especially in light of the history of  associated pain. A desmoid tumor or area of focal scarring could have a similar appearance. This area would be amendable to percutaneous biopsy.  Original Report Authenticated By: Rhodia Albright, M.D.   Assessment & Plan:  Patient here today with multiple complaints. 1) Pelvic and lower abdominal pain:  Pain over her cesarean scar lesion; no other clear etiology for her pain.  Will obtain another pelvic ultrasound to compare to the one in July 2014; will follow up results and manage accordingly.  Patient desires surgical evaluation.  Recommended diagnostic laparoscopy.  Risks reviewed.  Also recommended excision of the cesarean section scar lesion at the same time.  She was told she will be contacted by the surgical scheduler with date and time of her surgery. 2) Breasts complaints: No anomalies on exam. Recommended imaging, patient declined for now.  She will call/return with any worsening symptoms. 3) Decreased menstrual period length: Patient was reassured about this, can be due to variety of factors. Normal TSH, PRL and FSH in July 2014; did have elevated free testosterone.  Will follow up pelvic ultrasound results.  If abnormal bleeding continues, consider further evaluation.  Of note, patient also received the flu vaccine today.  Jaynie Collins, MD, FACOG Attending Obstetrician & Gynecologist Faculty Practice, [facility]

## 2013-08-08 NOTE — Progress Notes (Signed)
Pt. C/o of pelvic pain, intermittently, for the last two years. States pain is "bigger" now. Also reported having a period that lasted one day last week which is abnormal for patient.

## 2013-08-08 NOTE — Patient Instructions (Signed)
Diagnostic Laparoscopy  Laparoscopy is a surgical procedure. It is used to diagnose and treat diseases inside the belly (abdomen). It is usually a brief, common, and relatively simple procedure. The laparoscopeis a thin, lighted, pencil-sized instrument. It is like a telescope. It is inserted into your abdomen through a small cut (incision). Your caregiver can look at the organs inside your body through this instrument. He or she can see if there is anything abnormal.  Laparoscopy can be done either in a hospital or outpatient clinic. You may be given a mild sedative to help you relax before the procedure. Once in the operating room, you will be given a drug to make you sleep (general anesthesia). Laparoscopy usually lasts less than 1 hour. After the procedure, you will be monitored in a recovery area until you are stable and doing well. Once you are home, it will take 2 to 3 days to fully recover.  RISKS AND COMPLICATIONS   Laparoscopy has relatively few risks. Your caregiver will discuss the risks with you before the procedure.  Some problems that can occur include:  · Infection.  · Bleeding.  · Damage to other organs.  · Anesthetic side effects.  PROCEDURE  Once you receive anesthesia, your surgeon inflates the abdomen with a harmless gas (carbon dioxide). This makes the organs easier to see. The laparoscope is inserted into the abdomen through a small incision. This allows your surgeon to see into the abdomen. Other small instruments are also inserted into the abdomen through other small openings. Many surgeons attach a video camera to the laparoscope to enlarge the view.  During a diagnostic laparoscopy, the surgeon may be looking for inflammation, infection, or cancer. Your surgeon may take tissue samples(biopsies). The samples are sent to a specialist in looking at cells and tissue samples (pathologist). The pathologist examines them under a microscope. Biopsies can help to diagnose or confirm a  disease.  AFTER THE PROCEDURE   · The gas is released from inside the abdomen.  · The incisions are closed with stitches (sutures). Because these incisions are small (usually less than 1/2 inch), there is usually minimal discomfort after the procedure. There may be some mild discomfort in the throat. This is from the tube placed in the throat while you were sleeping. You may have some mild abdominal discomfort. There may also be discomfort from the instrument placement incisions in the abdomen.  · The recovery time is shortened as long as there are no complications.  · You will rest in a recovery room until stable and doing well. As long as there are no complications, you may be allowed to go home.  FINDING OUT THE RESULTS OF YOUR TEST  Not all test results are available during your visit. If your test results are not back during the visit, make an appointment with your caregiver to find out the results. Do not assume everything is normal if you have not heard from your caregiver or the medical facility. It is important for you to follow up on all of your test results.  HOME CARE INSTRUCTIONS   · Take all medicines as directed.  · Only take over-the-counter or prescription medicines for pain, discomfort, or fever as directed by your caregiver.  · Resume daily activities as directed.  · Showers are preferred over baths.  · You may resume sexual activities in 1 week or as directed.  · Do not drive while taking narcotics.  SEEK MEDICAL CARE IF:   · There is   increasing abdominal pain.  · There is new pain in the shoulders (shoulder strap areas).  · You feel lightheaded or faint.  · You have the chills.  · You or your child has an oral temperature above 102° F (38.9° C).  · There is pus-like (purulent) drainage from any of the wounds.  · You are unable to pass gas or have a bowel movement.  · You feel sick to your stomach (nauseous) or throw up (vomit).  MAKE SURE YOU:   · Understand these instructions.  · Will watch  your condition.  · Will get help right away if you are not doing well or get worse.  Document Released: 12/06/2000 Document Revised: 12/25/2012 Document Reviewed: 08/30/2007  ExitCare® Patient Information ©2014 [employer].

## 2013-08-13 ENCOUNTER — Encounter: Payer: Self-pay | Admitting: *Deleted

## 2013-08-20 ENCOUNTER — Other Ambulatory Visit: Payer: Self-pay | Admitting: Obstetrics & Gynecology

## 2013-08-20 ENCOUNTER — Ambulatory Visit (HOSPITAL_COMMUNITY)
Admission: RE | Admit: 2013-08-20 | Discharge: 2013-08-20 | Disposition: A | Discharge status: Home / Self Care | Payer: BC Managed Care – PPO | Source: Ambulatory Visit | Attending: Obstetrics & Gynecology | Admitting: Obstetrics & Gynecology

## 2013-08-20 DIAGNOSIS — G8929 Other chronic pain: Secondary | ICD-10-CM | POA: Insufficient documentation

## 2013-08-20 DIAGNOSIS — N72 Inflammatory disease of cervix uteri: Secondary | ICD-10-CM | POA: Insufficient documentation

## 2013-08-20 DIAGNOSIS — N949 Unspecified condition associated with female genital organs and menstrual cycle: Secondary | ICD-10-CM | POA: Insufficient documentation

## 2013-08-22 ENCOUNTER — Telehealth: Payer: Self-pay | Admitting: *Deleted

## 2013-08-22 NOTE — Telephone Encounter (Signed)
Message copied by Mannie Stabile on Wed Aug 22, 2013  8:37 AM ------      Message from: Jaynie Collins A      Created: Mon Aug 20, 2013  4:15 PM       No other findings apart form cesarean section scar mass which is a little larger.  Please call to inform patient of results. She is from [place] and speaks [place], she declines the use of a medical interpreter as she wants her husband to interpret for her.  She signed a Release of Responsibility for Interpretation form which is scanned and is under the Media tab. ------

## 2013-08-22 NOTE — Telephone Encounter (Signed)
Spoke with patient she stated that she understood me and didn't need her husband. I informed her of results. She voiced understanding.

## 2013-09-21 NOTE — Patient Instructions (Addendum)
   Your procedure is scheduled on: Thursday, Jan 15  [facility] at: 130 PM Pick up the phone at the desk and dial [phone] and inform us of your arrival.  Please call this number if you have any problems the morning of surgery: [phone]  Remember: Do not eat food after midnight: Wednesday Do not drink clear liquids after: 11 AM Thursday, day of surgery Take these medicines the morning of surgery with a SIP OF WATER:  None  Do not wear jewelry, make-up, or FINGER nail polish No metal in your hair or on your body. Do not wear lotions, powders, perfumes. You may wear deodorant.  Please use your CHG wash as directed prior to surgery.  Do not shave anywhere for at least 12 hours prior to first CHG shower.  Do not bring valuables to the hospital. Contacts, dentures or bridgework may not be worn into surgery.  Patients discharged on the day of surgery will not be allowed to drive home.  Home with husband cell [phone].   .Marland Kitchen

## 2013-09-24 ENCOUNTER — Encounter (HOSPITAL_COMMUNITY): Payer: Self-pay | Admitting: Pharmacist

## 2013-09-24 ENCOUNTER — Encounter (HOSPITAL_COMMUNITY): Payer: Self-pay

## 2013-09-24 ENCOUNTER — Encounter (HOSPITAL_COMMUNITY)
Admission: RE | Admit: 2013-09-24 | Discharge: 2013-09-24 | Disposition: A | Discharge status: Home / Self Care | Payer: BC Managed Care – PPO | Source: Ambulatory Visit | Attending: Obstetrics & Gynecology | Admitting: Obstetrics & Gynecology

## 2013-09-24 HISTORY — DX: Headache: R51

## 2013-09-24 LAB — CBC
HEMATOCRIT: 37.5 % (ref 36.0–46.0)
Hemoglobin: 12.3 g/dL (ref 12.0–15.0)
MCH: 25.4 pg — ABNORMAL LOW (ref 26.0–34.0)
MCHC: 32.8 g/dL (ref 30.0–36.0)
MCV: 77.3 fL — ABNORMAL LOW (ref 78.0–100.0)
PLATELETS: 273 10*3/uL (ref 150–400)
RBC: 4.85 MIL/uL (ref 3.87–5.11)
RDW: 15.5 % (ref 11.5–15.5)
WBC: 6.2 10*3/uL (ref 4.0–10.5)

## 2013-09-24 NOTE — Pre-Procedure Instructions (Signed)
Patient signed a release of responsibility for Interpretation and placed on chart.  Patient is using her husband as her Nurse, learning disabilitytranslator.

## 2013-09-25 ENCOUNTER — Encounter: Payer: Self-pay | Admitting: Obstetrics & Gynecology

## 2013-09-26 MED ORDER — DEXTROSE 5 % IV SOLN
2.0000 g | INTRAVENOUS | Status: AC
  Administered 2013-09-27: 2 g via INTRAVENOUS
  Filled 2013-09-26: qty 2

## 2013-09-27 ENCOUNTER — Ambulatory Visit (HOSPITAL_COMMUNITY)
Admission: RE | Admit: 2013-09-27 | Discharge: 2013-09-27 | Disposition: A | Discharge status: Home / Self Care | Payer: BC Managed Care – PPO | Source: Ambulatory Visit | Attending: Obstetrics & Gynecology | Admitting: Obstetrics & Gynecology

## 2013-09-27 ENCOUNTER — Encounter (HOSPITAL_COMMUNITY): Payer: BC Managed Care – PPO | Admitting: Anesthesiology

## 2013-09-27 ENCOUNTER — Encounter (HOSPITAL_COMMUNITY): Payer: Self-pay | Admitting: Anesthesiology

## 2013-09-27 ENCOUNTER — Encounter (HOSPITAL_COMMUNITY)
Admission: RE | Disposition: A | Discharge status: Home / Self Care | Payer: Self-pay | Source: Ambulatory Visit | Attending: Obstetrics & Gynecology

## 2013-09-27 ENCOUNTER — Ambulatory Visit (HOSPITAL_COMMUNITY): Payer: BC Managed Care – PPO | Admitting: Anesthesiology

## 2013-09-27 DIAGNOSIS — R102 Pelvic and perineal pain unspecified side: Secondary | ICD-10-CM | POA: Diagnosis present

## 2013-09-27 DIAGNOSIS — N926 Irregular menstruation, unspecified: Secondary | ICD-10-CM | POA: Insufficient documentation

## 2013-09-27 DIAGNOSIS — G8929 Other chronic pain: Secondary | ICD-10-CM | POA: Insufficient documentation

## 2013-09-27 DIAGNOSIS — L905 Scar conditions and fibrosis of skin: Secondary | ICD-10-CM | POA: Diagnosis present

## 2013-09-27 DIAGNOSIS — N806 Endometriosis in cutaneous scar: Secondary | ICD-10-CM | POA: Insufficient documentation

## 2013-09-27 DIAGNOSIS — N979 Female infertility, unspecified: Secondary | ICD-10-CM | POA: Insufficient documentation

## 2013-09-27 DIAGNOSIS — N949 Unspecified condition associated with female genital organs and menstrual cycle: Secondary | ICD-10-CM | POA: Insufficient documentation

## 2013-09-27 HISTORY — PX: LAPAROSCOPY: SHX197

## 2013-09-27 LAB — PREGNANCY, URINE: Preg Test, Ur: NEGATIVE

## 2013-09-27 LAB — CBC
HCT: 40.1 % (ref 36.0–46.0)
HEMOGLOBIN: 13 g/dL (ref 12.0–15.0)
MCH: 25.2 pg — ABNORMAL LOW (ref 26.0–34.0)
MCHC: 32.4 g/dL (ref 30.0–36.0)
MCV: 77.7 fL — ABNORMAL LOW (ref 78.0–100.0)
Platelets: 275 10*3/uL (ref 150–400)
RBC: 5.16 MIL/uL — AB (ref 3.87–5.11)
RDW: 15.5 % (ref 11.5–15.5)
WBC: 7.5 10*3/uL (ref 4.0–10.5)

## 2013-09-27 LAB — TYPE AND SCREEN
ABO/RH(D): B POS
Antibody Screen: NEGATIVE

## 2013-09-27 LAB — ABO/RH: ABO/RH(D): B POS

## 2013-09-27 SURGERY — LAPAROSCOPY, DIAGNOSTIC
Anesthesia: General | Site: Abdomen

## 2013-09-27 MED ORDER — ROCURONIUM BROMIDE 100 MG/10ML IV SOLN
INTRAVENOUS | Status: AC
  Filled 2013-09-27: qty 1

## 2013-09-27 MED ORDER — FENTANYL CITRATE 0.05 MG/ML IJ SOLN
25.0000 ug | INTRAMUSCULAR | Status: DC | PRN
  Administered 2013-09-27: 50 ug via INTRAVENOUS

## 2013-09-27 MED ORDER — PROPOFOL 10 MG/ML IV EMUL
INTRAVENOUS | Status: AC
  Filled 2013-09-27: qty 20

## 2013-09-27 MED ORDER — MEPERIDINE HCL 25 MG/ML IJ SOLN: 6.2500 mg | INTRAMUSCULAR | Status: DC | PRN

## 2013-09-27 MED ORDER — MIDAZOLAM HCL 2 MG/2ML IJ SOLN
INTRAMUSCULAR | Status: AC
  Filled 2013-09-27: qty 2

## 2013-09-27 MED ORDER — KETOROLAC TROMETHAMINE 30 MG/ML IJ SOLN
INTRAMUSCULAR | Status: DC | PRN
  Administered 2013-09-27: 30 mg via INTRAVENOUS

## 2013-09-27 MED ORDER — LIDOCAINE HCL (CARDIAC) 20 MG/ML IV SOLN
INTRAVENOUS | Status: AC
  Filled 2013-09-27: qty 5

## 2013-09-27 MED ORDER — ONDANSETRON HCL 4 MG/2ML IJ SOLN
INTRAMUSCULAR | Status: AC
  Filled 2013-09-27: qty 2

## 2013-09-27 MED ORDER — BUPIVACAINE HCL (PF) 0.5 % IJ SOLN
INTRAMUSCULAR | Status: DC | PRN
  Administered 2013-09-27: 19 mL

## 2013-09-27 MED ORDER — NEOSTIGMINE METHYLSULFATE 1 MG/ML IJ SOLN
INTRAMUSCULAR | Status: DC | PRN
  Administered 2013-09-27: 3 mg via INTRAVENOUS

## 2013-09-27 MED ORDER — OXYCODONE-ACETAMINOPHEN 5-325 MG PO TABS: 1.0000 | ORAL_TABLET | Freq: Four times a day (QID) | ORAL | Status: DC | PRN

## 2013-09-27 MED ORDER — FENTANYL CITRATE 0.05 MG/ML IJ SOLN
INTRAMUSCULAR | Status: DC | PRN
  Administered 2013-09-27: 100 ug via INTRAVENOUS
  Administered 2013-09-27: 150 ug via INTRAVENOUS

## 2013-09-27 MED ORDER — GLYCOPYRROLATE 0.2 MG/ML IJ SOLN
INTRAMUSCULAR | Status: DC | PRN
  Administered 2013-09-27: 0.4 mg via INTRAVENOUS

## 2013-09-27 MED ORDER — MIDAZOLAM HCL 2 MG/2ML IJ SOLN
INTRAMUSCULAR | Status: DC | PRN
  Administered 2013-09-27: 2 mg via INTRAVENOUS

## 2013-09-27 MED ORDER — BUPIVACAINE HCL (PF) 0.5 % IJ SOLN
INTRAMUSCULAR | Status: AC
  Filled 2013-09-27: qty 30

## 2013-09-27 MED ORDER — PROPOFOL 10 MG/ML IV BOLUS
INTRAVENOUS | Status: DC | PRN
  Administered 2013-09-27: 180 mg via INTRAVENOUS

## 2013-09-27 MED ORDER — DOCUSATE SODIUM 100 MG PO CAPS: 100.0000 mg | ORAL_CAPSULE | Freq: Two times a day (BID) | ORAL | Status: DC | PRN

## 2013-09-27 MED ORDER — FENTANYL CITRATE 0.05 MG/ML IJ SOLN
INTRAMUSCULAR | Status: AC
  Filled 2013-09-27: qty 2

## 2013-09-27 MED ORDER — IBUPROFEN 600 MG PO TABS: 600.0000 mg | ORAL_TABLET | Freq: Four times a day (QID) | ORAL | Status: DC | PRN

## 2013-09-27 MED ORDER — METOCLOPRAMIDE HCL 5 MG/ML IJ SOLN: 10.0000 mg | Freq: Once | INTRAMUSCULAR | Status: DC | PRN

## 2013-09-27 MED ORDER — LIDOCAINE HCL (CARDIAC) 20 MG/ML IV SOLN
INTRAVENOUS | Status: DC | PRN
  Administered 2013-09-27: 50 mg via INTRAVENOUS

## 2013-09-27 MED ORDER — LACTATED RINGERS IV SOLN
INTRAVENOUS | Status: DC
  Administered 2013-09-27 (×2): via INTRAVENOUS

## 2013-09-27 MED ORDER — FENTANYL CITRATE 0.05 MG/ML IJ SOLN
INTRAMUSCULAR | Status: AC
  Filled 2013-09-27: qty 5

## 2013-09-27 MED ORDER — DEXAMETHASONE SODIUM PHOSPHATE 10 MG/ML IJ SOLN
INTRAMUSCULAR | Status: AC
  Filled 2013-09-27: qty 1

## 2013-09-27 MED ORDER — ONDANSETRON HCL 4 MG/2ML IJ SOLN
INTRAMUSCULAR | Status: DC | PRN
  Administered 2013-09-27: 4 mg via INTRAVENOUS

## 2013-09-27 MED ORDER — ROCURONIUM BROMIDE 100 MG/10ML IV SOLN
INTRAVENOUS | Status: DC | PRN
  Administered 2013-09-27: 40 mg via INTRAVENOUS

## 2013-09-27 SURGICAL SUPPLY — 24 items
CABLE HIGH FREQUENCY MONO STRZ (ELECTRODE) IMPLANT
CHLORAPREP W/TINT 26ML (MISCELLANEOUS) ×3 IMPLANT
CLOTH BEACON ORANGE TIMEOUT ST (SAFETY) ×3 IMPLANT
COVER MAYO STAND STRL (DRAPES) ×3 IMPLANT
GLOVE BIO SURGEON STRL SZ7 (GLOVE) ×3 IMPLANT
GLOVE BIOGEL PI IND STRL 7.0 (GLOVE) ×1 IMPLANT
GLOVE BIOGEL PI INDICATOR 7.0 (GLOVE) ×2
GOWN PREVENTION PLUS LG XLONG (DISPOSABLE) ×6 IMPLANT
NEEDLE INSUFFLATION 120MM (ENDOMECHANICALS) ×3 IMPLANT
NS IRRIG 1000ML POUR BTL (IV SOLUTION) ×3 IMPLANT
PACK LAPAROSCOPY BASIN (CUSTOM PROCEDURE TRAY) ×3 IMPLANT
POUCH SPECIMEN RETRIEVAL 10MM (ENDOMECHANICALS) IMPLANT
PROTECTOR NERVE ULNAR (MISCELLANEOUS) ×3 IMPLANT
SCALPEL HARMONIC ACE (MISCELLANEOUS) IMPLANT
SET IRRIG TUBING LAPAROSCOPIC (IRRIGATION / IRRIGATOR) IMPLANT
SUT VIC AB 3-0 X1 27 (SUTURE) IMPLANT
SUT VICRYL 0 UR6 27IN ABS (SUTURE) ×6 IMPLANT
SUT VICRYL 4-0 PS2 18IN ABS (SUTURE) ×3 IMPLANT
TOWEL OR 17X24 6PK STRL BLUE (TOWEL DISPOSABLE) ×6 IMPLANT
TRAY FOLEY CATH 14FR (SET/KITS/TRAYS/PACK) ×3 IMPLANT
TROCAR XCEL NON-BLD 11X100MML (ENDOMECHANICALS) ×3 IMPLANT
TROCAR XCEL NON-BLD 5MMX100MML (ENDOMECHANICALS) IMPLANT
WARMER LAPAROSCOPE (MISCELLANEOUS) ×3 IMPLANT
WATER STERILE IRR 1000ML POUR (IV SOLUTION) ×3 IMPLANT

## 2013-09-27 NOTE — Discharge Instructions (Signed)

## 2013-09-27 NOTE — Transfer of Care (Signed)
Immediate Anesthesia Transfer of Care Note  Patient: Anna Hudson  Procedure(s) Performed: Procedure(s) with comments: LAPAROSCOPY DIAGNOSTIC (N/A) - Add excision of cesarean section scar endometrial tissue  Patient Location: PACU  Anesthesia Type:General  Level of Consciousness: awake, alert  and oriented  Airway & Oxygen Therapy: Patient Spontanous Breathing and Patient connected to nasal cannula oxygen  Post-op Assessment: Report given to PACU RN and Post -op Vital signs reviewed and stable  Post vital signs: Reviewed and stable  Complications: No apparent anesthesia complications

## 2013-09-27 NOTE — H&P (Signed)
Preoperative History and Physical  Anna Hudson is a 38 y.o. [id number] here for surgical management of painful cesarean section scar lesion and chronic pelvic pain.  Proposed surgery: Diagnostic laparoscopy, excision of cesarean section lesion  Past Medical History  Diagnosis Date  . Ulcer   . SVD (spontaneous vaginal delivery)     x 5  . Asthma     hx - no inhaler - no meds  . Headache(784.0)     otc med prn   Past Surgical History  Procedure Laterality Date  . Cesarean section      x 1 fetal demise   OB History   Grav Para Term Preterm Abortions TAB SAB Ect Mult Living   7 6 5 1 1  1   5      Patient denies any cervical dysplasia or STIs. Prescriptions prior to admission  Medication Sig Dispense Refill  . acetaminophen (TYLENOL) 325 MG tablet Take 650 mg by mouth every 6 (six) hours as needed for pain.      . cephALEXin (KEFLEX) 500 MG capsule Take 500 mg by mouth 3 (three) times daily.      . Chlorphen-Phenyleph-APAP 2-5-325 MG TABS Take 1 tablet by mouth as needed.      . diclofenac (VOLTAREN) 75 MG EC tablet Take 1 tablet (75 mg total) by mouth 2 (two) times daily as needed.  60 tablet  3  . diphenhydrAMINE (BENADRYL) 25 MG tablet Take 25 mg by mouth every 6 (six) hours as needed for allergies.      . meloxicam (MOBIC) 7.5 MG tablet Take 1 tablet (7.5 mg total) by mouth daily.  30 tablet  0  . Phenyleph-CPM-DM-APAP (ALKA-SELTZER PLUS COLD & COUGH) 01-12-09-325 MG CAPS Take 1 tablet by mouth as needed.        No Known Allergies Social History:   reports that she has never smoked. She has never used smokeless tobacco. She reports that she does not drink alcohol or use illicit drugs. No family history on file.  Review of Systems: Noncontributory  PHYSICAL EXAM: Blood pressure 120/72, pulse 74, temperature 98.6 F (37 C), temperature source Oral, resp. rate 20, SpO2 100.00%. General appearance - alert, well appearing, and in no distress Chest - clear to auscultation, no  wheezes, rales or rhonchi, symmetric air entry Heart - normal rate and regular rhythm Abdomen - soft, nondistended, nontender except for a 1 cm tender subcutaneous mass palpated in the midline section of the incision.  Pelvic - examination not indicated Extremities - peripheral pulses normal, no pedal edema, no clubbing or cyanosis  Labs: Results for orders placed during the hospital encounter of 09/27/13 (from the past 336 hour(s))  PREGNANCY, URINE   Collection Time    09/27/13  1:30 PM      Result Value Range   Preg Test, Ur NEGATIVE  NEGATIVE  CBC   Collection Time    09/27/13  2:05 PM      Result Value Range   WBC 7.5  4.0 - 10.5 K/uL   RBC 5.16 (*) 3.87 - 5.11 MIL/uL   Hemoglobin 13.0  12.0 - 15.0 g/dL   HCT 47.8  29.5 - 62.1 %   MCV 77.7 (*) 78.0 - 100.0 fL   MCH 25.2 (*) 26.0 - 34.0 pg   MCHC 32.4  30.0 - 36.0 g/dL   RDW 30.8  65.7 - 84.6 %   Platelets 275  150 - 400 K/uL  TYPE AND SCREEN   Collection Time  09/27/13  2:05 PM      Result Value Range   ABO/RH(D) B POS     Antibody Screen NEG     Sample Expiration 09/30/2013    ABO/RH   Collection Time    09/27/13  2:05 PM      Result Value Range   ABO/RH(D) B POS    Results for orders placed during the hospital encounter of 09/24/13 (from the past 336 hour(s))  CBC   Collection Time    09/24/13 12:36 PM      Result Value Range   WBC 6.2  4.0 - 10.5 K/uL   RBC 4.85  3.87 - 5.11 MIL/uL   Hemoglobin 12.3  12.0 - 15.0 g/dL   HCT 21.3  08.6 - 57.8 %   MCV 77.3 (*) 78.0 - 100.0 fL   MCH 25.4 (*) 26.0 - 34.0 pg   MCHC 32.8  30.0 - 36.0 g/dL   RDW 46.9  62.9 - 52.8 %   Platelets 273  150 - 400 K/uL    Imaging Studies: 08/20/13 TRANSABDOMINAL AND TRANSVAGINAL ULTRASOUND OF PELVIS CLINICAL DATA: Chronic female pelvic pain. TECHNIQUE: Both transabdominal and transvaginal ultrasound examinations of the pelvis were performed. Transabdominal technique was performed for global imaging of the pelvis including uterus,  ovaries, adnexal regions, and pelvic cul-de-sac. It was necessary to proceed with endovaginal exam following the transabdominal exam to visualize the  uterus and ovaries to better advantage. COMPARISON: 03/30/2013 FINDINGS: Uterus  Measurements: 9.4 cm x 5.2 cm x 6.7 cm. Endometrium has a heterogeneous echogenicity, but there is no discrete uterine mass. Uterus is anteverted. The cervix shows a small nabothian cyst but is otherwise unremarkable appear Endometrium Thickness: 6 mm. Sliver of endometrial canal fluid in the upper uterine segment is stable from the prior study. No endometrial mass. Right ovary  Measurements: 26 mm x 26 mm x 21 mm. Normal appearance/no adnexal mass.  Left ovary Measurements: 37 mm x 20 mm x 31 mm. Normal appearance/no adnexal mass.  Other findings There is a hypoechoic lobulated mass along the deep C-section scar. The midline measuring 14 mm x 11 mm x 15 mm. This is similar and appearance of prior exam although measures slightly larger. The measurement difference may be technical only. There is a small amount of pelvic free fluid, likely physiologic. IMPRESSION: 1. The irregular hypoechoic mass along the deep margin of the C-section scar measures slightly larger than on the prior exam, now  1.5 cm in greatest dimension. This difference could be technical only or reflect a true mild increase in size. This could reflect a masslike area of scar tissue. A fiber/desmoid tumor is possible. This does not have a typical appearance of an endometrioma. 2. Uterus and ovaries are essentially unremarkable. Small amount pelvic free fluid, likely physiologic.   Assessment: Patient Active Problem List   Diagnosis Date Noted  . Scar tissue lesion 09/27/2013  . Irregular menstrual bleeding 03/26/2013  . 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 Chronic female pelvic pain 03/26/2013    Plan: Patient will undergo surgical management with diagnostic laparoscopy, excision of cesarean section  lesion.   The risks of surgery were discussed in detail with the patient including but not limited to: bleeding which may require transfusion; infection which may require antibiotics; injury to surrounding organs which may involve bowel, bladder, ureters ; need for additional procedures including laparotomy; surgical site problems and other postoperative/anesthesia complications. Likelihood of success in alleviating the patient's condition was discussed. Routine postoperative  instructions will be reviewed with the patient and her family in detail after surgery.  The patient concurred with the proposed plan, giving informed written consent for the surgery.  Patient has been NPO since last night she will remain NPO for procedure.  Anesthesia and OR aware.  Preoperative prophylactic antibiotics and SCDs ordered on call to the OR.  To OR when ready.  Jaynie CollinsUGONNA  Dakota Vanwart, M.D. 09/27/2013 4:32 PM

## 2013-09-27 NOTE — Anesthesia Postprocedure Evaluation (Signed)
  Anesthesia Post-op Note  Patient: Anna Hudson  Procedure(s) Performed: Procedure(s) with comments: LAPAROSCOPY DIAGNOSTIC (N/A) - Add excision of cesarean section scar endometrial tissue  Patient Location: PACU  Anesthesia Type:General  Level of Consciousness: awake, alert  and oriented  Airway and Oxygen Therapy: Patient Spontanous Breathing  Post-op Pain: mild  Post-op Assessment: Post-op Vital signs reviewed, Patient's Cardiovascular Status Stable, Respiratory Function Stable, Patent Airway, No signs of Nausea or vomiting and Pain level controlled  Post-op Vital Signs: Reviewed and stable  Complications: No apparent anesthesia complications

## 2013-09-27 NOTE — Anesthesia Procedure Notes (Signed)
Procedure Name: Intubation Date/Time: 09/27/2013 4:59 PM Performed by: Shanon PayorGREGORY, [place] Doster M Pre-anesthesia Checklist: Suction available, Emergency Drugs available, Timeout performed, Patient identified and Patient being monitored Patient Re-evaluated:Patient Re-evaluated prior to inductionOxygen Delivery Method: Circle system utilized Preoxygenation: Pre-oxygenation with 100% oxygen Intubation Type: IV induction Ventilation: Mask ventilation without difficulty Laryngoscope Size: Mac and 3 Grade View: Grade I Tube type: Oral Tube size: 7.0 mm Number of attempts: 1 Airway Equipment and Method: Stylet Dental Injury: Teeth and Oropharynx as per pre-operative assessment

## 2013-09-27 NOTE — Anesthesia Preprocedure Evaluation (Signed)
Anesthesia Evaluation  Patient identified by MRN, date of birth, ID band Patient awake    Reviewed: Allergy & Precautions, H&P , NPO status , Patient's Chart, lab work & pertinent test results  Airway Mallampati: III TM Distance: >3 FB Neck ROM: Full    Dental no notable dental hx. (+) Teeth Intact   Pulmonary asthma ,  breath sounds clear to auscultation  Pulmonary exam normal       Cardiovascular negative cardio ROS  Rhythm:Regular Rate:Normal     Neuro/Psych  Headaches, negative psych ROS   GI/Hepatic negative GI ROS, Neg liver ROS,   Endo/Other  negative endocrine ROSObesity  Renal/GU negative Renal ROS     Musculoskeletal negative musculoskeletal ROS (+)   Abdominal (+) + obese,   Peds  Hematology negative hematology ROS (+)   Anesthesia Other Findings   Reproductive/Obstetrics Pelvic pain Irregular menses                           Anesthesia Physical Anesthesia Plan  ASA: II  Anesthesia Plan: General   Post-op Pain Management:    Induction: Intravenous  Airway Management Planned: Oral ETT  Additional Equipment:   Intra-op Plan:   Post-operative Plan: Extubation in OR  Informed Consent: I have reviewed the patients History and Physical, chart, labs and discussed the procedure including the risks, benefits and alternatives for the proposed anesthesia with the patient or authorized representative who has indicated his/her understanding and acceptance.   Dental advisory given  Plan Discussed with: CRNA, Anesthesiologist and Surgeon  Anesthesia Plan Comments:         Anesthesia Quick Evaluation

## 2013-09-27 NOTE — Op Note (Signed)
Anna Hudson PROCEDURE DATE: 09/27/2013  PREOPERATIVE DIAGNOSES: Chronic pelvic pain, painful cesarean section scar lesion POSTOPERATIVE DIAGNOSES: The same PROCEDURE: Diagnostic laparoscopy, excision of cesarean section scar lesion SURGEON:  Dr. Jaynie CollinsUgonna Anyanwu ASSISTANT: Dr. Scheryl DarterJames Arnold  INDICATIONS: 38 y.o. [id number] with history of chronic pelvic pain and painful cesarean section scar lesion desiring surgical evaluation.   Please see preoperative notes for further details.  Risks of surgery were discussed with the patient including but not limited to: bleeding which may require transfusion or reoperation; infection which may require antibiotics; injury to bowel, bladder, ureters or other surrounding organs; need for additional procedures including laparotomy; thromboembolic phenomenon, incisional problems and other postoperative/anesthesia complications. Written informed consent was obtained.    FINDINGS:  Small uterus, normal ovaries bilaterally. Bilateral fallopian tubes are ligated with blue-green permanent sutures in the mid-isthmic regions. No adhesions and no evidence of endometriosis. Normal upper abdomen.  On evaluation of the cesarean section scar, there was a 2 x 3 cm nodular, rubbery, firm lesion in the subcutaneous layer involving about 1 cm of underlying fascia.    ANESTHESIA:    General INTRAVENOUS FLUIDS: 1700 ml ESTIMATED BLOOD LOSS: 20 ml SPECIMENS: Cesarean section scar lesion COMPLICATIONS: None immediate  PROCEDURE IN DETAIL:  The patient had sequential compression devices applied to her lower extremities while in the preoperative area.  She was then taken to the operating room where general anesthesia was administered and was found to be adequate.  She was placed in the dorsal lithotomy position, and was prepped and draped in a sterile manner.  A Foley catheter was inserted into her bladder and attached to constant drainage and a uterine manipulator was then advanced into  the uterus .  After an adequate timeout was performed, attention was turned to the abdomen where an umbilical incision was made with the scalpel.  The Optiview 11-mm trocar and sleeve were then advanced without difficulty with the laparoscope under direct visualization into the abdomen.  The abdomen was then insufflated with carbon dioxide gas and adequate pneumoperitoneum was obtained.  A survey of the patient's pelvis and abdomen revealed the findings above. The abdomen was desufflated and all instruments were then removed from the patient's abdomen. The uterine manipulator was removed without complications.  Attention was then turned to the midportion of her cesarean section scar.  A 3 cm incision was made on the scar and carried down to the level of the subcutaneous lesion.  The lesion was grasped with Allis clamps and excised from surrounding tissue using blunt and sharp methods. Electrocautery was also used to achieve hemostasis in the subcutaneous layer.  A portion of the lesion was noted to be connected to about a 1 cm portion of the fascia.  This fascial defect was closed with 0 Vicryl running stitch.  The subcutaneous layer was approximated with interrupted 2-0 plain gut stitches and the skin was closed with a 3-0 Vicryl subcuticular stitch.  All incisions were closed with Dermabond. The patient tolerated the procedures well.  All instruments, needles, and sponge counts were correct x 2. The patient was taken to the recovery room in stable condition.   The patient will be discharged to home as per PACU criteria.  Routine postoperative instructions given.  She was prescribed Percocet, Ibuprofen and Colace.  She will follow up in the clinic in 3-4 weeks for postoperative evaluation.   Jaynie CollinsUGONNA  ANYANWU, MD, FACOG Attending Obstetrician & Gynecologist Faculty Practice, [facility]

## 2013-09-28 ENCOUNTER — Encounter (HOSPITAL_COMMUNITY): Payer: Self-pay | Admitting: Obstetrics & Gynecology

## 2013-10-01 ENCOUNTER — Telehealth: Payer: Self-pay

## 2013-10-01 ENCOUNTER — Encounter: Payer: Self-pay | Admitting: Obstetrics & Gynecology

## 2013-10-01 NOTE — Telephone Encounter (Addendum)
Appt scheduled with Dr. April MansonYalcinkaya for February 20th @ [facility]00 [facility]

## 2013-10-01 NOTE — Telephone Encounter (Signed)
Message copied by Faythe CasaBELLAMY, Blayde Bacigalupi M on Mon Oct 01, 2013  4:28 PM ------      Message from: Jaynie CollinsANYANWU, UGONNA A      Created: Thu Sep 27, 2013  6:13 PM      Regarding: Needs postoperative appointment and referral to Dr. April MansonYalcinkaya       Had diagnostic laparoscopy, excision of cesarean section lesion on 09/27/13            Needs to see me in 4 weeks            Also needs referral to Dr. April MansonYalcinkaya, desires tubal reversal            Thanks!            Vela ProseUgonna ------

## 2013-10-02 NOTE — Telephone Encounter (Signed)
Called and spoke with patients husband stated to do so in chart.  I informed him of her appt that we scheduled for infertility with Dr. April MansonYalcinkaya on February 20th @ 0900 and his appt scheduled with the clinics on February 16th @1315 .   I also advised him to please call her insurance company to make sure that they cover infertility visits cause it can be costly.  Pt's husband stated ok that he will do that and had no other questions.

## 2013-10-24 NOTE — OR Nursing (Signed)
Surgery End time 1808

## 2013-10-29 ENCOUNTER — Encounter: Payer: Self-pay | Admitting: Obstetrics & Gynecology

## 2013-10-29 ENCOUNTER — Ambulatory Visit (INDEPENDENT_AMBULATORY_CARE_PROVIDER_SITE_OTHER): Payer: BC Managed Care – PPO | Admitting: Obstetrics & Gynecology

## 2013-10-29 VITALS — BP 112/81 | HR 72 | Temp 97.6°F | Ht 65.0 in | Wt 204.7 lb

## 2013-10-29 DIAGNOSIS — B379 Candidiasis, unspecified: Secondary | ICD-10-CM

## 2013-10-29 DIAGNOSIS — Z09 Encounter for follow-up examination after completed treatment for conditions other than malignant neoplasm: Secondary | ICD-10-CM

## 2013-10-29 MED ORDER — FLUCONAZOLE 150 MG PO TABS: 150.0000 mg | ORAL_TABLET | Freq: Once | ORAL | Status: DC

## 2013-10-29 NOTE — Patient Instructions (Signed)
Return to clinic for any scheduled appointments or for any gynecologic concerns as needed.   

## 2013-10-29 NOTE — Progress Notes (Signed)
   CLINIC ENCOUNTER NOTE  History:  38 y.o. [id number] here today for follow up after diagnostic laparoscopy, excision of cesarean section scar lesion for chronic pelvic pain, painful cesarean section scar lesion on 09/27/13.  Accompanied by her husband. Denies any postoperative problems but reports occasional vulvar itching. Declines pelvic exam due to ongoing period.  The following portions of the patient's history were reviewed and updated as appropriate: allergies, current medications, past family history, past medical history, past social history, past surgical history and problem list.  Review of Systems:  Pertinent items are noted in HPI.  Objective:  Physical Exam BP 112/81  Pulse 72  Temp(Src) 97.6 F (36.4 C) (Oral)  Ht 5\' 5"  (1.651 m)  Wt 204 lb 11.2 oz (92.851 kg)  BMI 34.06 kg/m2  LMP 10/23/2013 Gen: NAD Abd: Soft, nontender and nondistended. Incision is C/D/I Pelvic: Deferred  09/27/13 Surgical Pathology: ENDOMETRIOSIS AND SCAR.  Assessment & Plan:   Operative findings and surgical pathology reviewed Diflucan prescribed presumptively for now, she was told to come in for worsening symptoms Routine preventative health maintenance measures emphasized.   Jaynie CollinsUGONNA  Sadhana Frater, MD, FACOG Attending Obstetrician & Gynecologist Faculty Practice, [facility]

## 2013-12-28 ENCOUNTER — Ambulatory Visit (INDEPENDENT_AMBULATORY_CARE_PROVIDER_SITE_OTHER): Payer: BC Managed Care – HMO | Admitting: Family Medicine

## 2013-12-28 VITALS — BP 110/72 | HR 66 | Temp 98.5°F | Resp 16 | Ht 65.0 in | Wt 210.6 lb

## 2013-12-28 DIAGNOSIS — R300 Dysuria: Secondary | ICD-10-CM

## 2013-12-28 DIAGNOSIS — N390 Urinary tract infection, site not specified: Secondary | ICD-10-CM

## 2013-12-28 LAB — POCT URINALYSIS DIPSTICK
Bilirubin, UA: NEGATIVE
Glucose, UA: NEGATIVE
Ketones, UA: NEGATIVE
Nitrite, UA: POSITIVE
PROTEIN UA: 30
SPEC GRAV UA: 1.015
Urobilinogen, UA: 0.2
pH, UA: 5.5

## 2013-12-28 LAB — POCT UA - MICROSCOPIC ONLY
CRYSTALS, UR, HPF, POC: NEGATIVE
Mucus, UA: NEGATIVE
Yeast, UA: NEGATIVE

## 2013-12-28 MED ORDER — SULFAMETHOXAZOLE-TMP DS 800-160 MG PO TABS: 1.0000 | ORAL_TABLET | Freq: Two times a day (BID) | ORAL | Status: DC

## 2013-12-28 MED ORDER — PHENAZOPYRIDINE HCL 100 MG PO TABS
100.0000 mg | ORAL_TABLET | Freq: Three times a day (TID) | ORAL | Status: DC | PRN
Start: 2013-12-28 — End: 2015-04-18

## 2013-12-28 NOTE — Patient Instructions (Addendum)
Drink plenty of fluids  Take the sulfamethoxazole one twice daily for one week  Take the phenazopyridine 1 pill 3 times daily as needed for urine pain. It will make your urine an orange/red color.  After one to 2 days you can stop it and only use it if you are hurting.  Return if worse, especially if running a high fever or having severe pain.

## 2013-12-28 NOTE — Progress Notes (Signed)
Subjective: [place] from [place], speaks a little [place], husband is with her and interprets. Dysuria started yesterday. No fever. No back pain. No suprapubic pain.  Objective: No CVA tenderness. Mild suprapubic tenderness.  Results for orders placed in visit on 12/28/13  POCT UA - MICROSCOPIC ONLY      Result Value Ref Range   WBC, Ur, HPF, POC tntc     RBC, urine, microscopic 1-4     Bacteria, U Microscopic 3+     Mucus, UA neg     Epithelial cells, urine per micros 0-3     Crystals, Ur, HPF, POC neg     Casts, Ur, LPF, POC renal Tubular     Yeast, UA neg    POCT URINALYSIS DIPSTICK      Result Value Ref Range   Color, UA yellow     Clarity, UA cloudy     Glucose, UA neg     Bilirubin, UA neg     Ketones, UA neg     Spec Grav, UA 1.015     Blood, UA moderate     pH, UA 5.5     Protein, UA 30     Urobilinogen, UA 0.2     Nitrite, UA positive     Leukocytes, UA large (3+)     Assessment: Urinary tract infection Obesity  Plan: Bactrim DS twice a day x1 week since the staff reported how strongly urine was decided to treat on longer side.

## 2013-12-30 IMAGING — US US PELVIS COMPLETE
1 series · 13 of 25 positions shown · non-contrast
Comparison: None

CLINICAL DATA: Irregular menses.  Prior C-section.  LMP 03/21/2013



[Series 1: us pelvis complete · 13 of 131 slices shown]
[im 1/131]
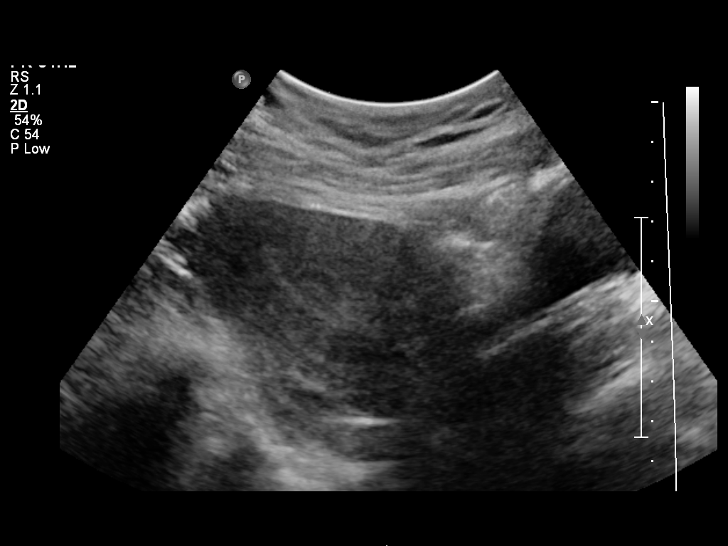
[im 11/131]
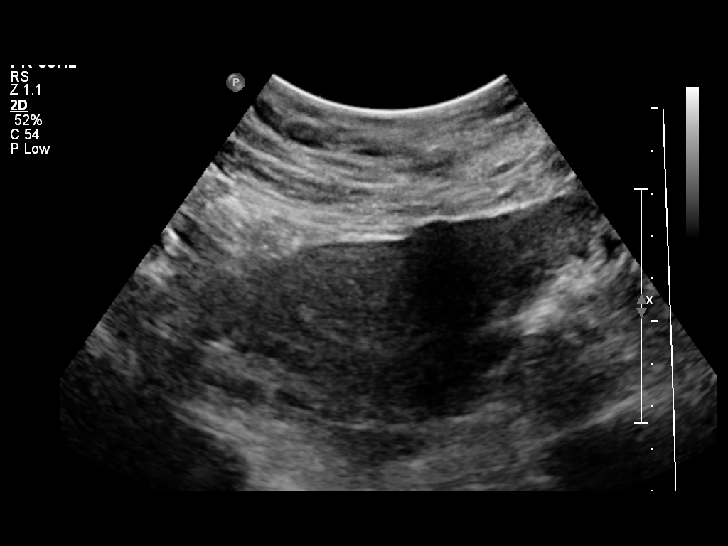
[im 22/131]
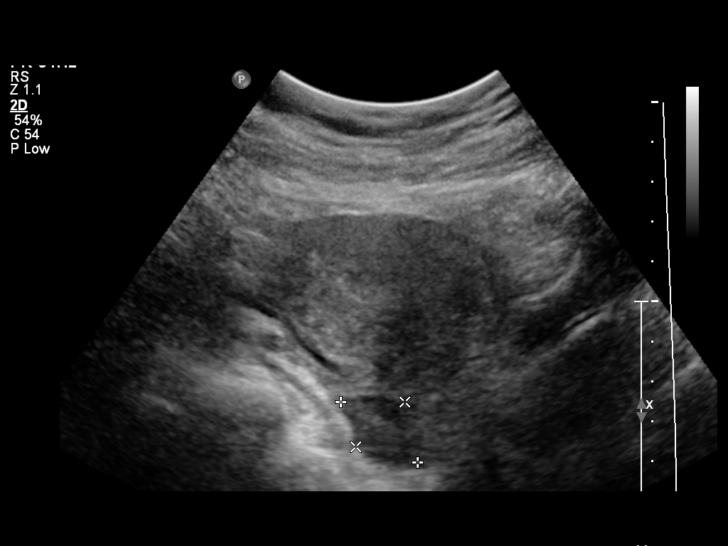
[im 33/131]
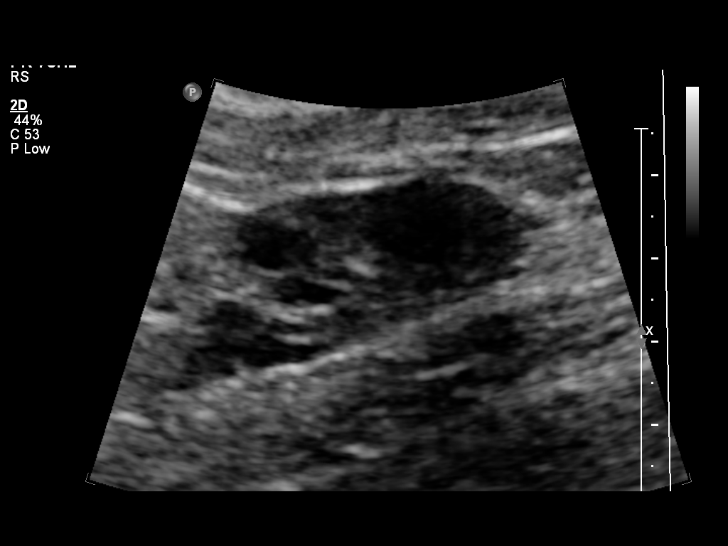
[im 44/131]
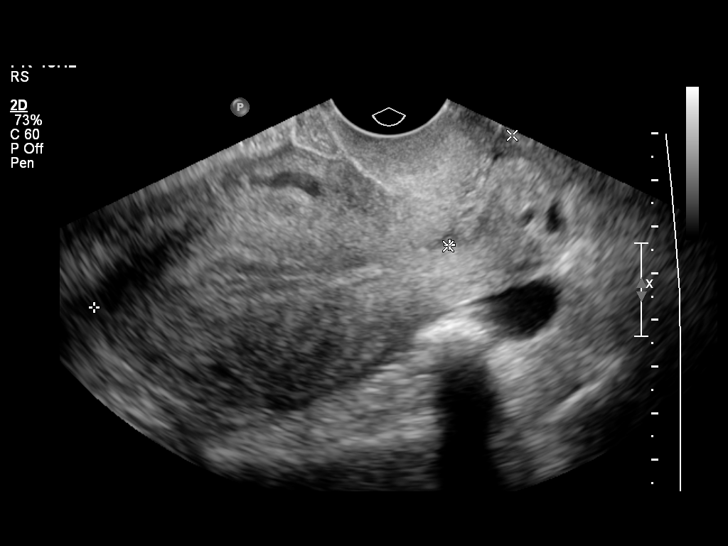
[im 55/131]
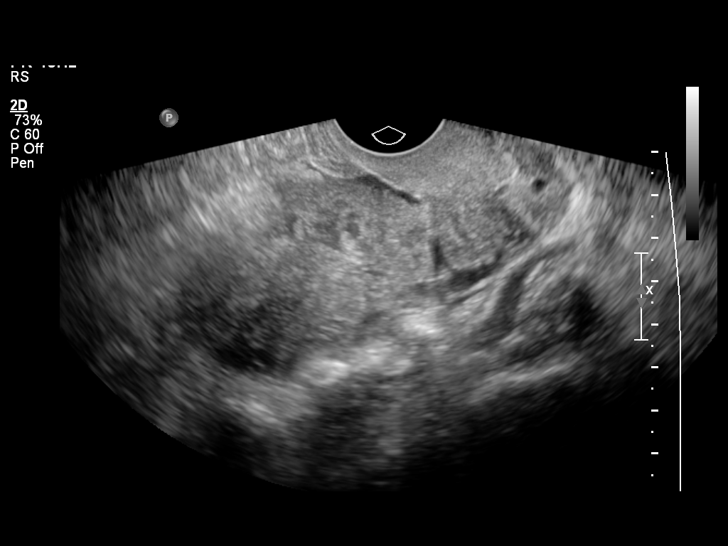
[im 66/131]
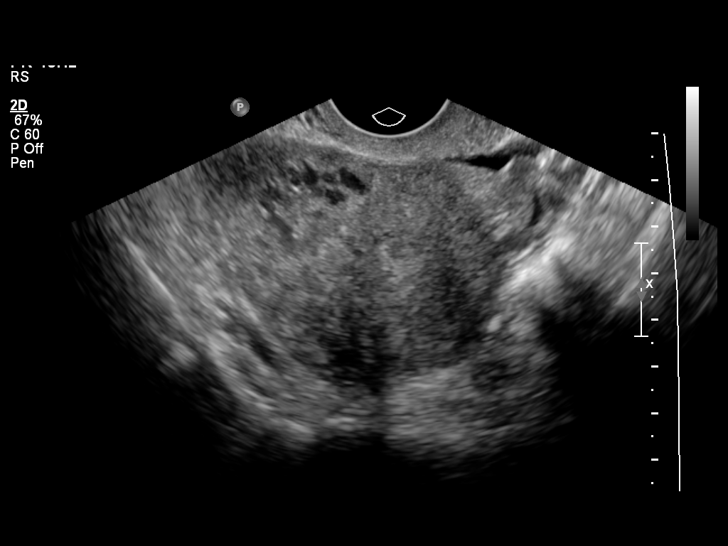
[im 76/131]
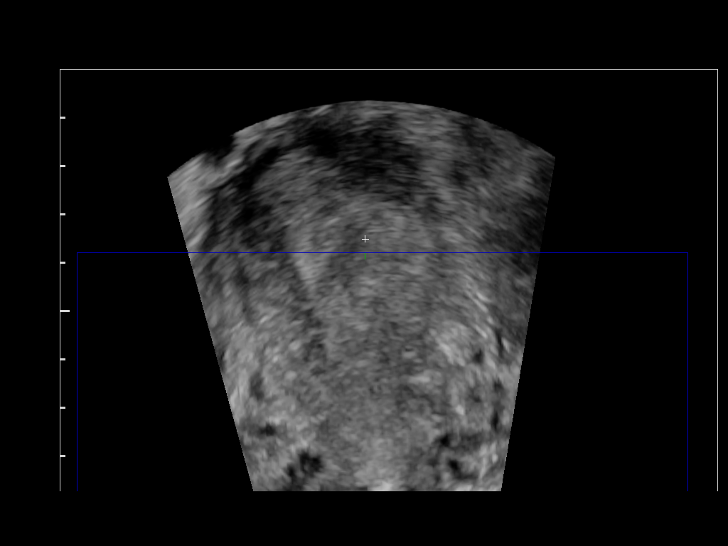
[im 87/131]
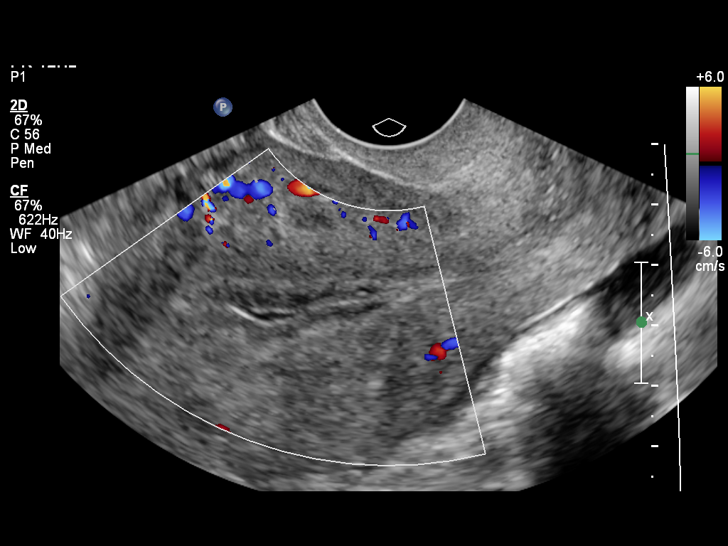
[im 98/131]
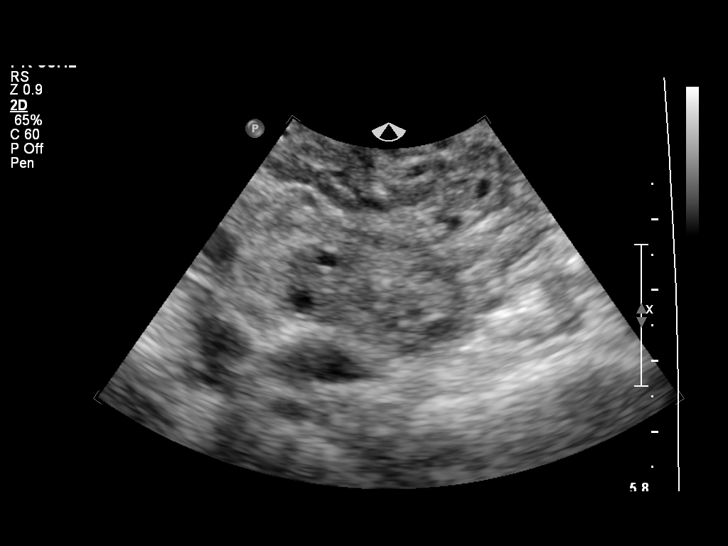
[im 109/131]
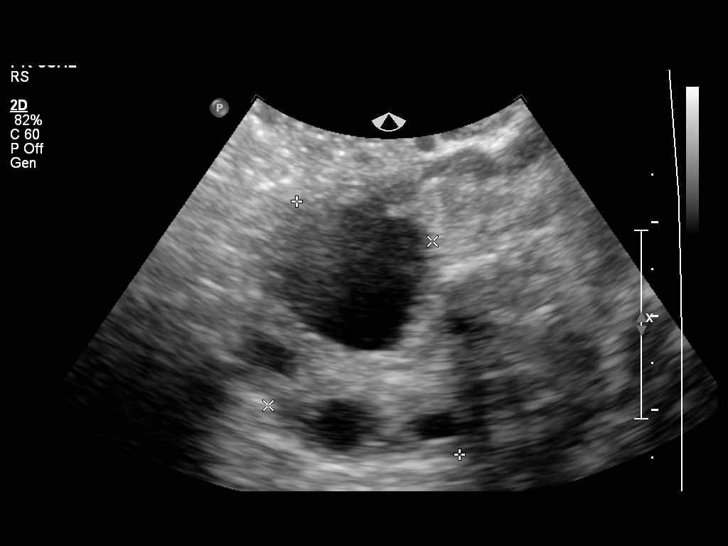
[im 120/131]
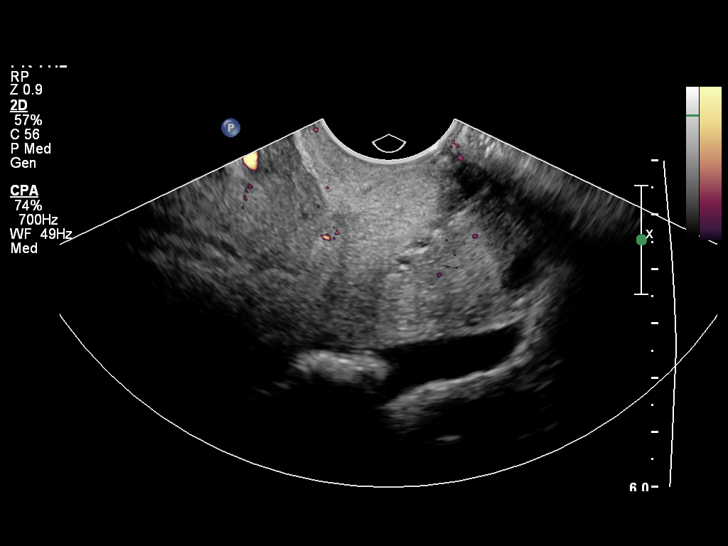
[im 131/131]
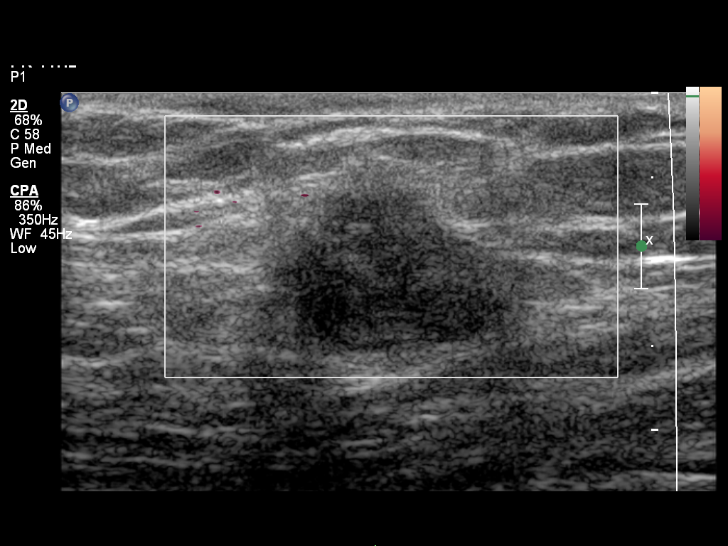

[13 of 25 positions shown; findings below may reference images not displayed]

FINDINGS: Uterus: Is anteverted and anteflexed and demonstrates a sagittal
length of 10.5 cm, depth of 5.6 cm and width of 6.9 cm.  No focal
mural abnormality is seen.

Endometrium: Tri-layered pattern with a width of 12 mm.  This would
correlate with a periovulatory endometrium and correspond with the
provided LMP of 03/21/2013.  No areas of focal thickening or
heterogeneity are seen

Right ovary:  Has a normal appearance measuring 2.3 by 2.9 x 1.6 cm

Left ovary: Measures 3.2 x 2.5 x 3.3 cm and contains a dominant
follicle

Other findings: A trace of simple free fluid is noted in the cul-de-
sac.

The patient reported an area of focal tenderness in the region of
the C-section scar and scanning in the subcutaneous tissue over the
location of tenderness reveals a hypoechoic irregular soft tissue
mass measuring 1.1 x 1.1 x 1.2 cm.  This demonstrates no
significant intralesional flow with color Doppler exam.
IMPRESSION: Normal periovulatory uterine myometrium, endometrium and ovaries.

Irregular hypoechoic soft tissue mass corresponding with the
patient's area of tenderness in the region of the C-section scar.
The appearance raises the possibility of endometriosis implant
within the C-section scar especially in light of the history of
associated pain..  A desmoid tumor or area of focal scarring could
have a similar appearance.  This area would be amendable to
percutaneous biopsy..

## 2013-12-31 LAB — URINE CULTURE

## 2014-05-22 IMAGING — US US PELVIS COMPLETE
1 series · 13 of 25 positions shown · non-contrast
Comparison: 03/30/2013

CLINICAL DATA: Chronic female pelvic pain.

EXAM:
TRANSABDOMINAL AND TRANSVAGINAL ULTRASOUND OF PELVIS
TECHNIQUE: Both transabdominal and transvaginal ultrasound examinations of the
pelvis were performed. Transabdominal technique was performed for
global imaging of the pelvis including uterus, ovaries, adnexal
regions, and pelvic cul-de-sac. It was necessary to proceed with
endovaginal exam following the transabdominal exam to visualize the
uterus and ovaries to better advantage.

[Series 1: us pelvis complete · 13 of 89 slices shown]
[im 1/89]
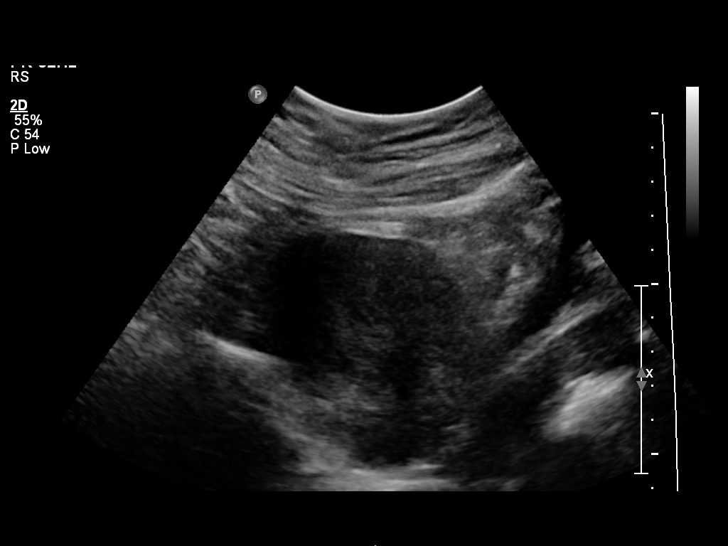
[im 8/89]
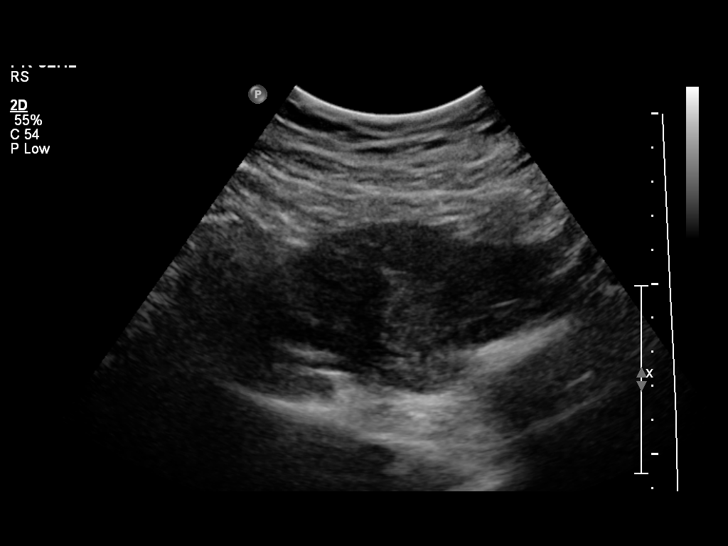
[im 15/89]
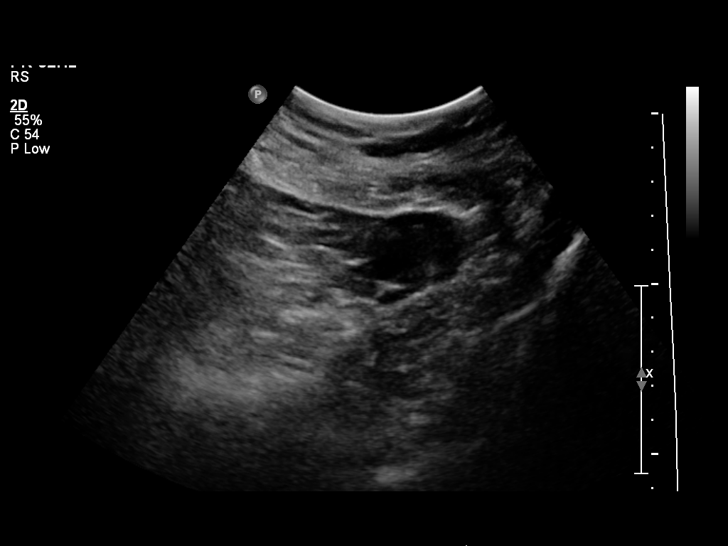
[im 23/89]
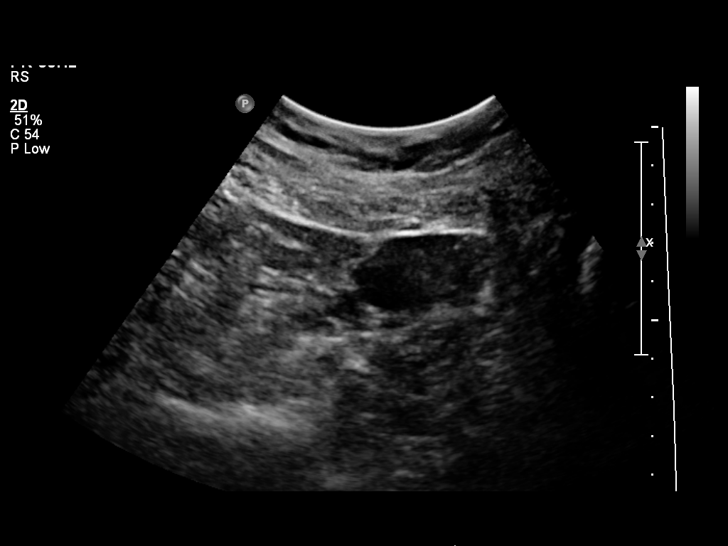
[im 30/89]
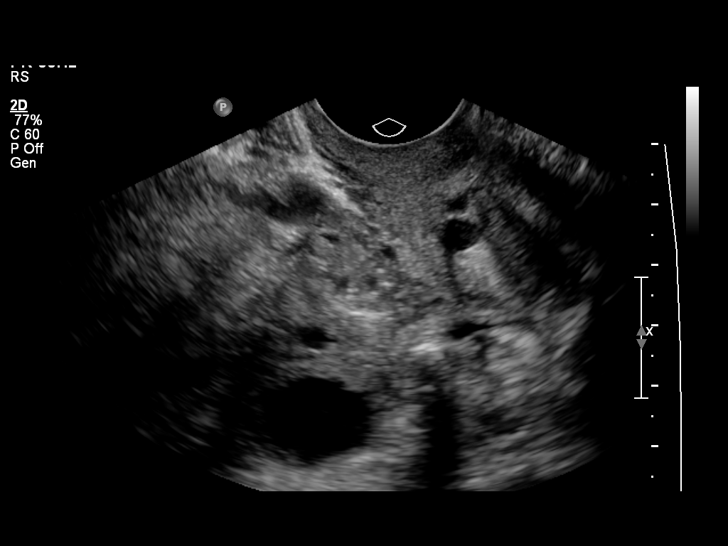
[im 37/89]
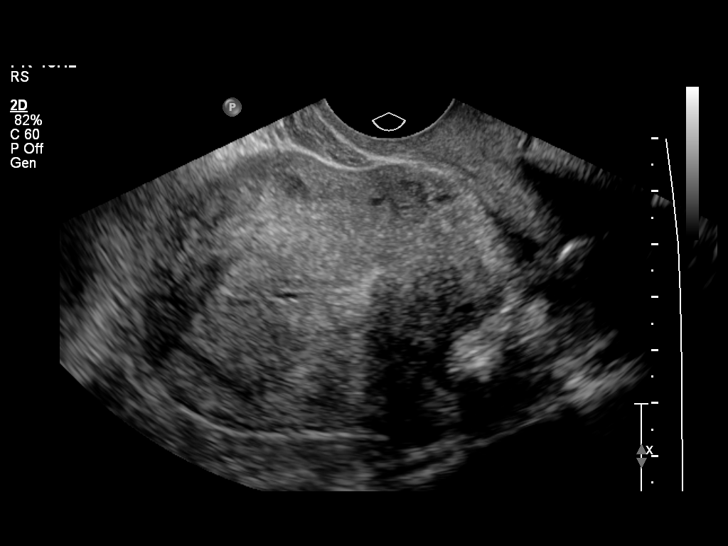
[im 45/89]
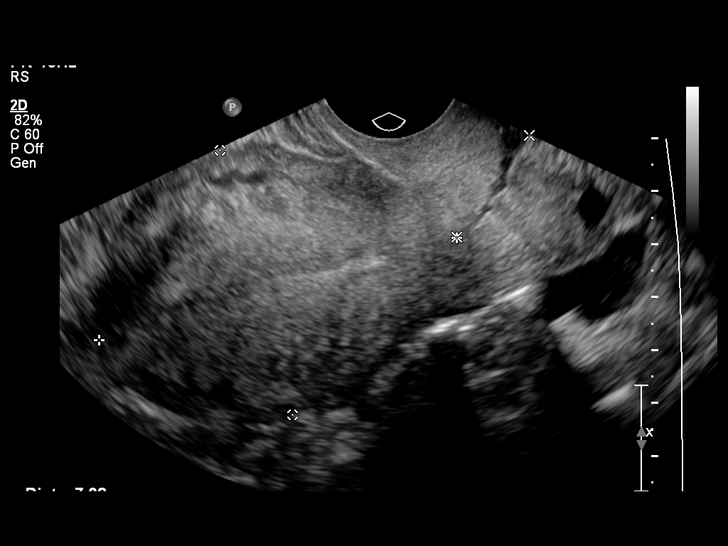
[im 52/89]
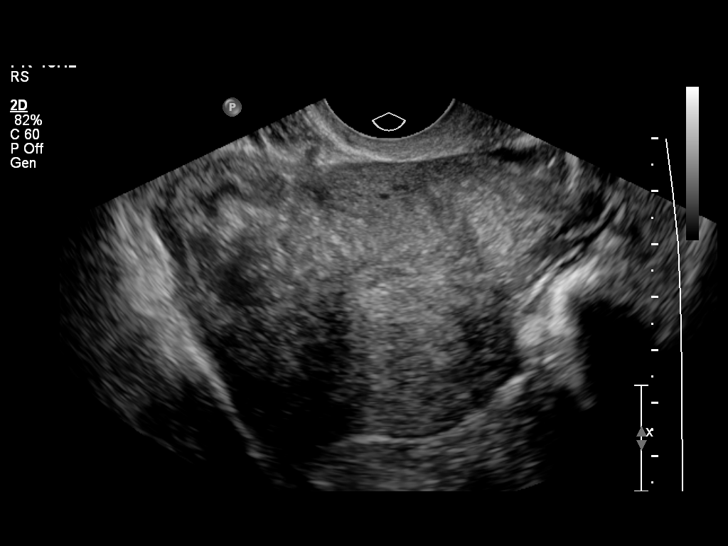
[im 59/89]
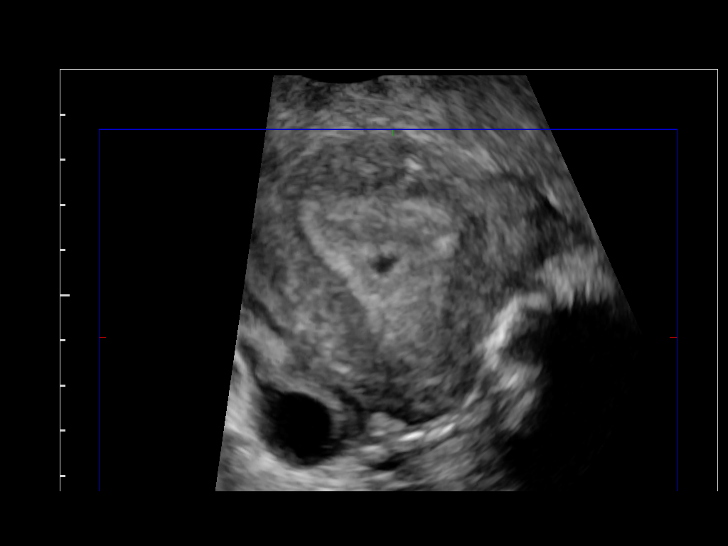
[im 67/89]
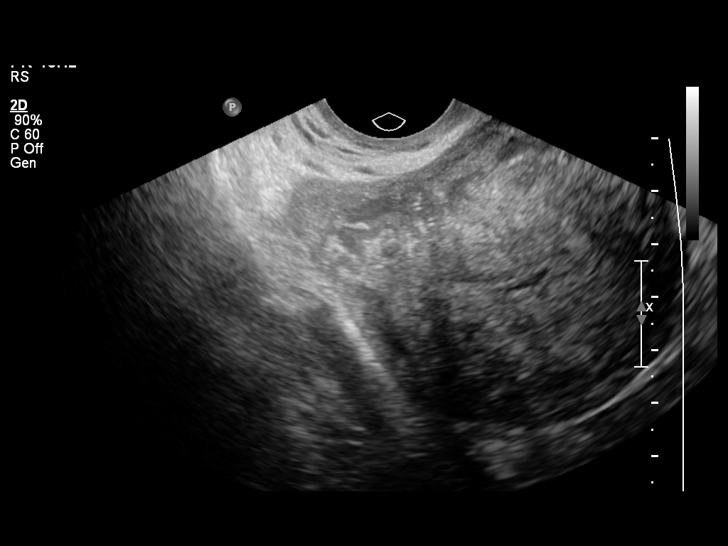
[im 74/89]
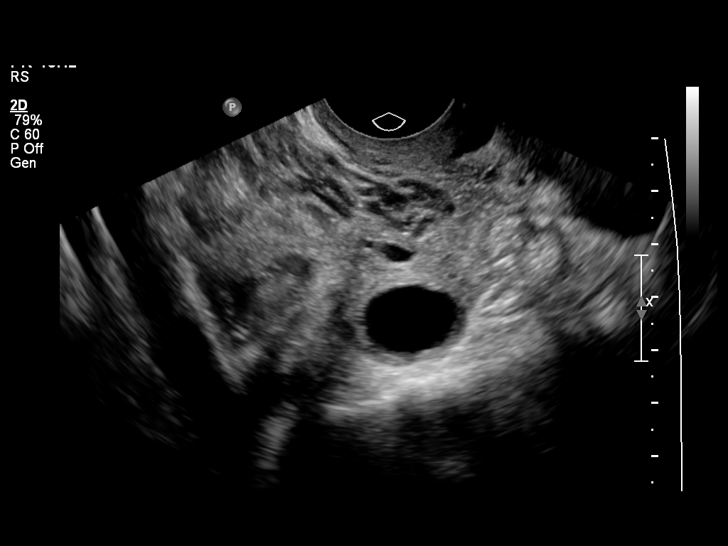
[im 81/89]
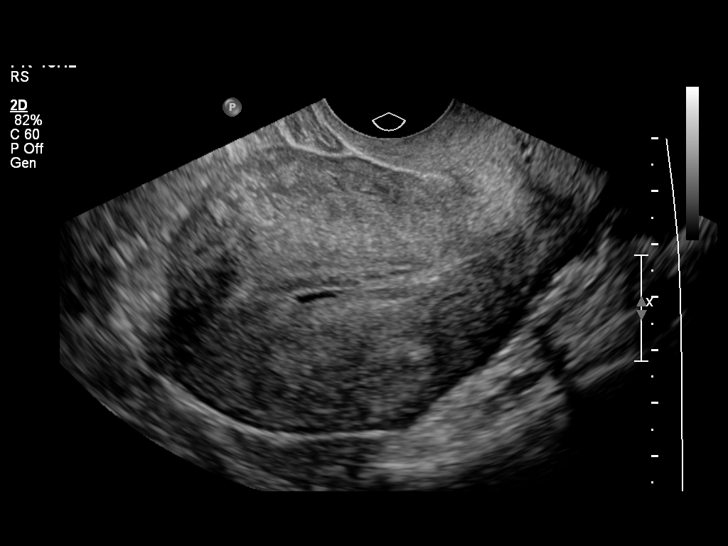
[im 89/89]
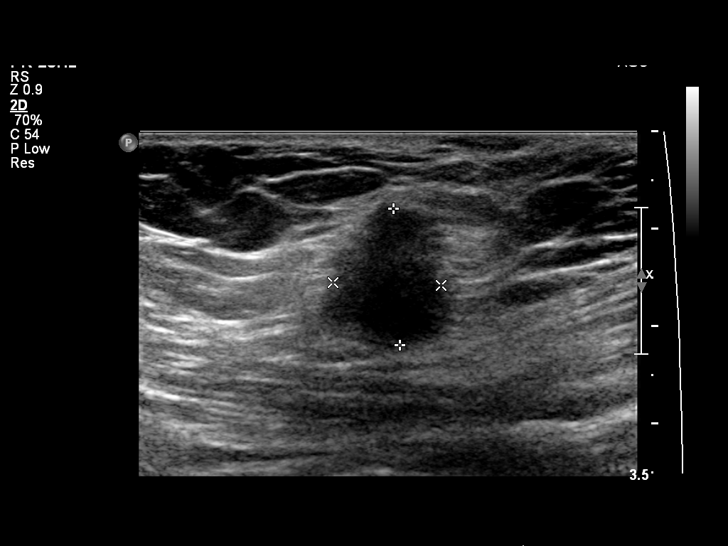

[13 of 25 positions shown; findings below may reference images not displayed]

FINDINGS: Uterus

Measurements: 9.4 cm x 5.2 cm x 6.7 cm. Endometrium has a
heterogeneous echogenicity, but there is no discrete uterine mass.
Uterus is anteverted. The cervix shows a small nabothian cyst but is
otherwise unremarkable appear

Endometrium

Thickness: 6 mm. Sliver of endometrial canal fluid in the upper
uterine segment is stable from the prior study. No endometrial mass.

Right ovary

Measurements: 26 mm x 26 mm x 21 mm. Normal appearance/no adnexal
mass.

Left ovary

Measurements: 37 mm x 20 mm x 31 mm. Normal appearance/no adnexal
mass.

Other findings

There is a hypoechoic lobulated mass along the deep C-section scar.
The midline measuring 14 mm x 11 mm x 15 mm. This is similar and
appearance of prior exam although measures slightly larger. The
measurement difference may be technical only.

There is a small amount of pelvic free fluid, likely physiologic.
IMPRESSION: 1. The irregular hypoechoic mass along the deep margin of the
C-section scar measures slightly larger than on the prior exam, now
1.5 cm in greatest dimension. This difference could be technical
only or reflect a true mild increase in size. This could reflect a
masslike area of scar tissue. A fiber/desmoid tumor is possible.
This does not have a typical appearance of an endometrioma.
2. Uterus and ovaries are essentially unremarkable. Small amount
pelvic free fluid, likely physiologic.

## 2015-01-20 ENCOUNTER — Ambulatory Visit (INDEPENDENT_AMBULATORY_CARE_PROVIDER_SITE_OTHER): Payer: BLUE CROSS/BLUE SHIELD | Admitting: Physician Assistant

## 2015-01-20 VITALS — BP 122/80 | HR 65 | Temp 97.8°F | Resp 18 | Ht 64.5 in | Wt 232.0 lb

## 2015-01-20 DIAGNOSIS — E049 Nontoxic goiter, unspecified: Secondary | ICD-10-CM | POA: Diagnosis not present

## 2015-01-20 NOTE — Progress Notes (Signed)
[facility] [address], [place] [address] [phone]  Date:  01/20/2015   Name:  Anna Hudson   DOB:  1976/01/08   MRN:  [id number]  PCP:  No PCP Per Patient    Chief Complaint: Edema   History of Present Illness:  Anna Hudson is a 39 y.o. very pleasant female patient who presents with the following:  She is here with husband.   Neck swelling that has been prominent for 3 months.  When she has a cold, it is more prominent when she coughs, though she does not note any recent infection prior to the onset of swelling.  The neck is nontender with normal movement.  There is no difficulty with swallowing or dyspnea.  She has no night sweats.  She has some weight gain that she has noticed. No changes in skin dryness or oily skin.  She is sleeping well, but has some depressive like thoughts.  There is no change in the appetite, diarrhea, or constipation.  There is no hx of thyroid disease.   She has no known familial hx of thyroid disease.     Patient Active Problem List   Diagnosis Date Noted  . Scar tissue lesion 09/27/2013  . Irregular menstrual bleeding 03/26/2013  . 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 Chronic female pelvic pain 03/26/2013    Past Medical History  Diagnosis Date  . Ulcer   . SVD (spontaneous vaginal delivery)     x 5  . Asthma     hx - no inhaler - no meds  . Headache(784.0)     otc med prn    Past Surgical History  Procedure Laterality Date  . Cesarean section      x 1 fetal demise  . Laparoscopy N/A 09/27/2013    Procedure: LAPAROSCOPY DIAGNOSTIC;  Surgeon: Tereso NewcomerUgonna A Anyanwu, MD;  Location: [facility];  Service: Gynecology;  Laterality: N/A;  Add excision of cesarean section scar endometrial tissue    History  Substance Use Topics  . Smoking status: Never Smoker   . Smokeless tobacco: Never Used  . Alcohol Use: No    History reviewed. No pertinent family history.  No Known Allergies  Medication list has  been reviewed and updated.  Current Outpatient Prescriptions on File Prior to Visit  Medication Sig Dispense Refill  . fluconazole (DIFLUCAN) 150 MG tablet Take 1 tablet (150 mg total) by mouth once. (Patient not taking: Reported on 01/20/2015) 1 tablet 3  . ibuprofen (ADVIL,MOTRIN) 600 MG tablet Take 1 tablet (600 mg total) by mouth every 6 (six) hours as needed. (Patient not taking: Reported on 01/20/2015) 30 tablet 1  . phenazopyridine (PYRIDIUM) 100 MG tablet Take 1 tablet (100 mg total) by mouth 3 (three) times daily as needed for pain. (Patient not taking: Reported on 01/20/2015) 10 tablet 0  . Phenyleph-CPM-DM-APAP (ALKA-SELTZER PLUS COLD & COUGH) 01-12-09-325 MG CAPS Take 1 tablet by mouth as needed.    . sulfamethoxazole-trimethoprim (BACTRIM DS) 800-160 MG per tablet Take 1 tablet by mouth 2 (two) times daily. (Patient not taking: Reported on 01/20/2015) 14 tablet 0   No current facility-administered medications on file prior to visit.    Review of Systems: ROS otherwise unremarkable unless listed above.  Physical Examination: Filed Vitals:   01/20/15 1121  BP: 122/80  Pulse: 65  Temp: 97.8 F (36.6 C)  Resp: 18   Filed Vitals:   01/20/15 1121  Height: 5' 4.5" (1.638 m)  Weight:  232 lb (105.235 kg)   Wt Readings from Last 3 Encounters:  01/20/15 232 lb (105.235 kg)  12/28/13 210 lb 9.6 oz (95.528 kg)  10/29/13 204 lb 11.2 oz (92.851 kg)    Body mass index is 39.22 kg/(m^2). Ideal Body Weight: Weight in (lb) to have BMI = 25: 147.6  Physical Exam  Constitutional: She appears well-developed and well-nourished. No distress.  HENT:  Head: Normocephalic and atraumatic.  Eyes: EOM are normal. Pupils are equal, round, and reactive to light.  Neck: Trachea normal, normal range of motion and full passive range of motion without pain. Carotid bruit is not present. Thyromegaly (Thyroid is full bilaterally, wihout nodules or masses.  No bruits detected.) present. No thyroid mass  present.  Cardiovascular: Normal rate, regular rhythm, normal heart sounds and intact distal pulses.  Exam reveals no gallop, no distant heart sounds and no friction rub.   No murmur heard. Pulmonary/Chest: Effort normal and breath sounds normal. No stridor. No respiratory distress. She has no decreased breath sounds. She has no wheezes. She has no rhonchi.  Abdominal: Soft. Bowel sounds are normal. She exhibits no distension and no mass. There is no hepatosplenomegaly. There is tenderness in the suprapubic area. There is no CVA tenderness, no tenderness at McBurney's point and negative Murphy's sign.  Lymphadenopathy:    She has no cervical adenopathy.  Skin: She is not diaphoretic.  Psychiatric: She has a normal mood and affect. Her behavior is normal. Thought content normal.     Assessment and Plan: 39 year old female is here today for swelling at neck that has been noticed for 3 months.  -TSH, free t3 and free t4 obtained -US of thyroid prior for not obvious nodularities or masses  Thyroid enlarged - Plan: TSH, T4, Free, T3, Free, US Soft Tissue Head/Neck  Trena PlattStephanie Aleeah Greeno, PA-C [facility] 5/9/20168:41 PM

## 2015-01-20 NOTE — Patient Instructions (Addendum)
Please await contact for your appointment for the ultrasound.   I will contact you with the results of your blood work.

## 2015-01-21 LAB — T4, FREE: Free T4: 0.9 ng/dL (ref 0.80–1.80)

## 2015-01-21 LAB — T3, FREE: T3 FREE: 3 pg/mL (ref 2.3–4.2)

## 2015-01-21 LAB — TSH: TSH: 1.157 u[IU]/mL (ref 0.350–4.500)

## 2015-01-23 ENCOUNTER — Telehealth: Payer: Self-pay

## 2015-01-23 NOTE — Telephone Encounter (Signed)
Pt's husband is calling to check on his wife's lab results. Please advise at [phone]

## 2015-01-23 NOTE — Telephone Encounter (Signed)
Notes Recorded by Gerrianne ScaleAngela L Payne on 01/23/2015 at 1:23 PM Left message on pt's machine to call back Notes Recorded by Garnetta BuddyStephanie D English, PA on 01/23/2015 at 1:18 PM Please advise patient that her thyroid function is normal. The free t4, t3, and tsh are within normal limits. We will await ultrasound scheduling at this time.  Called husband, no VM to leave message.

## 2015-01-23 NOTE — Telephone Encounter (Signed)
Spoke with husband advised lab results. Pt understood.

## 2015-01-24 ENCOUNTER — Ambulatory Visit
Admission: RE | Admit: 2015-01-24 | Discharge: 2015-01-24 | Disposition: A | Discharge status: Home / Self Care | Payer: BLUE CROSS/BLUE SHIELD | Source: Ambulatory Visit | Attending: Physician Assistant | Admitting: Physician Assistant

## 2015-01-24 DIAGNOSIS — E049 Nontoxic goiter, unspecified: Secondary | ICD-10-CM

## 2015-01-29 ENCOUNTER — Encounter: Payer: Self-pay | Admitting: Family Medicine

## 2015-02-04 ENCOUNTER — Other Ambulatory Visit: Payer: Self-pay | Admitting: Physician Assistant

## 2015-02-04 DIAGNOSIS — E042 Nontoxic multinodular goiter: Secondary | ICD-10-CM

## 2015-02-10 ENCOUNTER — Other Ambulatory Visit: Payer: Self-pay | Admitting: Physician Assistant

## 2015-02-10 DIAGNOSIS — R9389 Abnormal findings on diagnostic imaging of other specified body structures: Secondary | ICD-10-CM

## 2015-02-11 ENCOUNTER — Telehealth: Payer: Self-pay

## 2015-02-11 NOTE — Telephone Encounter (Signed)
Pt's husband called saying he missed a CB from us concerning his wife's lab results. Please advise at [phone]

## 2015-02-13 NOTE — Telephone Encounter (Signed)
I attempted to call back.  I did not leave message.  Please refer to imaging note: Nodules found on thyroid.  We will need to get a sample from two of the nodules, so we will have you return to the imaging, once scheduled, where you will have the biopsy.  This is a low risk procedure, with minimal pain.

## 2015-02-14 ENCOUNTER — Other Ambulatory Visit: Payer: Self-pay | Admitting: Physician Assistant

## 2015-02-14 DIAGNOSIS — E042 Nontoxic multinodular goiter: Secondary | ICD-10-CM

## 2015-03-11 ENCOUNTER — Ambulatory Visit
Admission: RE | Admit: 2015-03-11 | Discharge: 2015-03-11 | Disposition: A | Discharge status: Home / Self Care | Payer: BLUE CROSS/BLUE SHIELD | Source: Ambulatory Visit | Attending: Physician Assistant | Admitting: Physician Assistant

## 2015-03-11 ENCOUNTER — Other Ambulatory Visit (HOSPITAL_COMMUNITY)
Admission: RE | Admit: 2015-03-11 | Discharge: 2015-03-11 | Disposition: A | Discharge status: Home / Self Care | Payer: BLUE CROSS/BLUE SHIELD | Source: Ambulatory Visit | Attending: Interventional Radiology | Admitting: Interventional Radiology

## 2015-03-11 DIAGNOSIS — E041 Nontoxic single thyroid nodule: Secondary | ICD-10-CM | POA: Diagnosis present

## 2015-03-11 DIAGNOSIS — E042 Nontoxic multinodular goiter: Secondary | ICD-10-CM

## 2015-04-01 ENCOUNTER — Telehealth: Payer: Self-pay | Admitting: *Deleted

## 2015-04-01 NOTE — Telephone Encounter (Signed)
Pt husband called wanting results of the most recent labs his wife the pt had.  I advised pt that the results were back but the doctor has to review them.  I advised husband that once the results are back he would hear from us or the doctor.  He understood.

## 2015-04-03 NOTE — Telephone Encounter (Signed)
Not sure what labs he was talking about. Hasn't been seen since 01/2015. LM letting them know what those labs said and that a letter was sent

## 2015-04-18 ENCOUNTER — Ambulatory Visit (INDEPENDENT_AMBULATORY_CARE_PROVIDER_SITE_OTHER): Payer: BLUE CROSS/BLUE SHIELD | Admitting: Family Medicine

## 2015-04-18 VITALS — BP 114/77 | HR 58 | Temp 98.2°F | Resp 16 | Ht 64.0 in | Wt 229.0 lb

## 2015-04-18 DIAGNOSIS — H1013 Acute atopic conjunctivitis, bilateral: Secondary | ICD-10-CM | POA: Diagnosis not present

## 2015-04-18 DIAGNOSIS — J301 Allergic rhinitis due to pollen: Secondary | ICD-10-CM | POA: Diagnosis not present

## 2015-04-18 DIAGNOSIS — J3489 Other specified disorders of nose and nasal sinuses: Secondary | ICD-10-CM | POA: Diagnosis not present

## 2015-04-18 MED ORDER — IPRATROPIUM BROMIDE 0.03 % NA SOLN: 2.0000 | Freq: Four times a day (QID) | NASAL | Status: AC

## 2015-04-18 MED ORDER — PREDNISONE 20 MG PO TABS: ORAL_TABLET | ORAL | Status: AC

## 2015-04-18 NOTE — Patient Instructions (Signed)
You have bad allergies!   We are going to treat you with prednisone for 3 days Also use the nasal spray as needed for itchy and runny nose Take over the counter claritin or zyrtec once a day You might also try Genteal dry eye gel for your eyes

## 2015-04-18 NOTE — Progress Notes (Signed)
[facility] [address], [place] [address] [phone]  Date:  04/18/2015   Name:  Anna Hudson   DOB:  November 20, 1975   MRN:  [id number]  PCP:  No PCP Per Patient    Chief Complaint: Allergies; itchy eyes; and Ear Problem   History of Present Illness:  Anna Hudson is a 39 y.o. very pleasant female patient who presents with the following:  She is here today with itching of her nose, eyes, ears.  This has been coming and going all summer. She is sneezing a lot. She has a little bit of a cough but not much.    She has tried some benadryl but it does not help her much  No glasses or contacts used  No vision change.  LMP was 2 weeks ago  Patient Active Problem List   Diagnosis Date Noted  . Scar tissue lesion 09/27/2013  . Irregular menstrual bleeding 03/26/2013  . 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 Chronic female pelvic pain 03/26/2013    Past Medical History  Diagnosis Date  . Ulcer   . SVD (spontaneous vaginal delivery)     x 5  . Asthma     hx - no inhaler - no meds  . Headache(784.0)     otc med prn    Past Surgical History  Procedure Laterality Date  . Cesarean section      x 1 fetal demise  . Laparoscopy N/A 09/27/2013    Procedure: LAPAROSCOPY DIAGNOSTIC;  Surgeon: Tereso Newcomer, MD;  Location: [facility];  Service: Gynecology;  Laterality: N/A;  Add excision of cesarean section scar endometrial tissue    History  Substance Use Topics  . Smoking status: Never Smoker   . Smokeless tobacco: Never Used  . Alcohol Use: No    Family History  Problem Relation Age of Onset  . Diabetes Father   . Hypertension Father     No Known Allergies  Medication list has been reviewed and updated.  No current outpatient prescriptions on file prior to visit.   No current facility-administered medications on file prior to visit.    Review of Systems:  As per HPI- otherwise negative.   Physical Examination: Filed Vitals:    04/18/15 1355  BP: 114/77  Pulse: 58  Temp: 98.2 F (36.8 C)  Resp: 16   Filed Vitals:   04/18/15 1355  Height:  (1.626 m)  Weight: 229 lb (103.874 kg)   Body mass index is 39.29 kg/(m^2). Ideal Body Weight: Weight in (lb) to have BMI = 25: 145.3  GEN: WDWN, NAD, Non-toxic, A & O x 3, runny nose and watery eyes- appears miserable with allergies HEENT: Atraumatic, Normocephalic. Neck supple. No masses, No LAD.  Bilateral TM wnl, oropharynx normal.  PEERL,EOMI.  Nasal cavity is inflamed and congested Ears and Nose: No external deformity. CV: RRR, No M/G/R. No JVD. No thrill. No extra heart sounds. PULM: CTA B, no wheezes, crackles, rhonchi. No retractions. No resp. distress. No accessory muscle use. EXTR: No c/c/e NEURO Normal gait.  PSYCH: Normally interactive. Conversant. Not depressed or anxious appearing.  Calm demeanor.    Assessment and Plan: Allergic rhinitis due to pollen - Plan: predniSONE (DELTASONE) 20 MG tablet, ipratropium (ATROVENT) 0.03 % nasal spray  Allergic conjunctivitis, bilateral  Rhinorrhea - Plan: ipratropium (ATROVENT) 0.03 % nasal spray  Will use a short burst of steroids for severe AR sx Also atrovent, otc antihistamines, eye gel Follow-up if not  better soon  Signed Abbe Amsterdam, MD

## 2015-12-11 IMAGING — US US THYROID BIOPSY
1 series · 14 of 18 positions shown · non-contrast
Comparison: none

CLINICAL DATA: Thyromegaly with dominant inferior left complex
nodule. There was a concern of a left isthmic nodule but on further
assessment this region appears to be contiguous with the dominant
inferior left lobe mass.

EXAM:
ULTRASOUND-GUIDED THYROID ASPIRATION BIOPSY
TECHNIQUE: The procedure, risks (including but not limited to bleeding,
infection, organ damage ), benefits, and alternatives were explained
to the patient. Questions regarding the procedure were encouraged
and answered. The patient understands and consents to the procedure.

[Series 1: us thyroid biopsy · 0.07mm/px · 18 acquisitions, 14 frames shown]
[im 1/18]
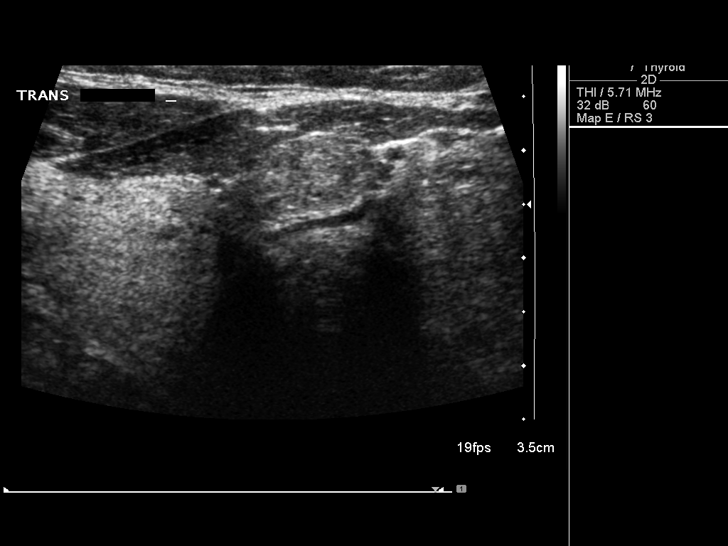
[im 2/18]
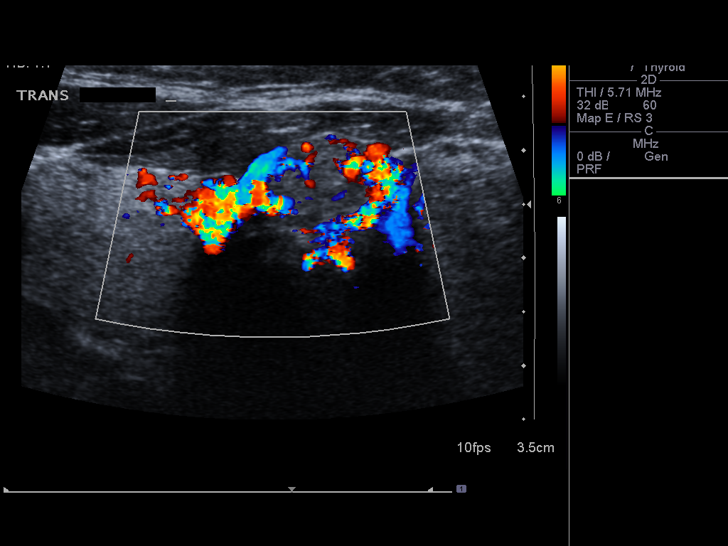
[im 4/18]
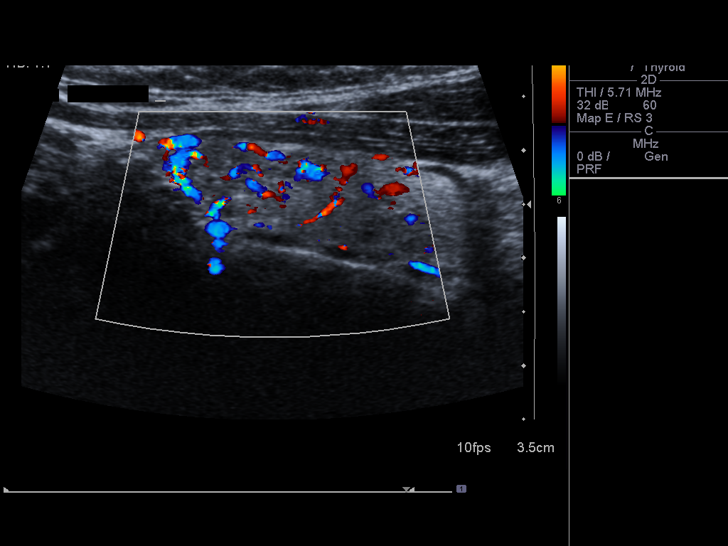
[im 5/18]
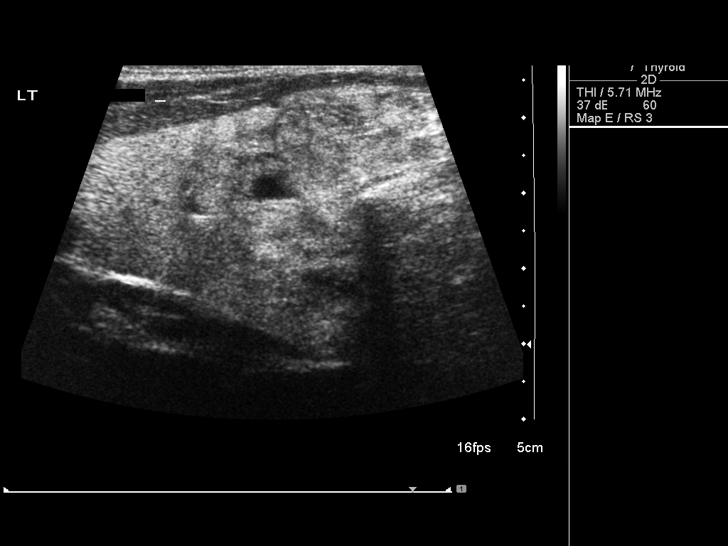
[im 6/18]
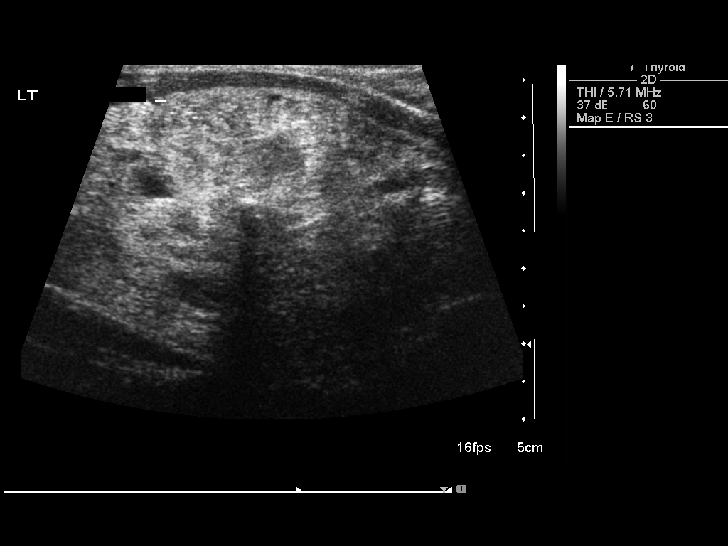
[im 8/18]
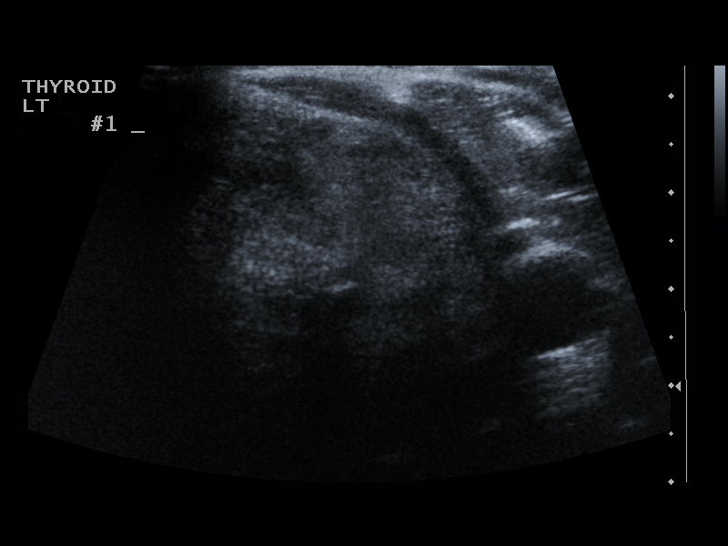
[im 9/18]
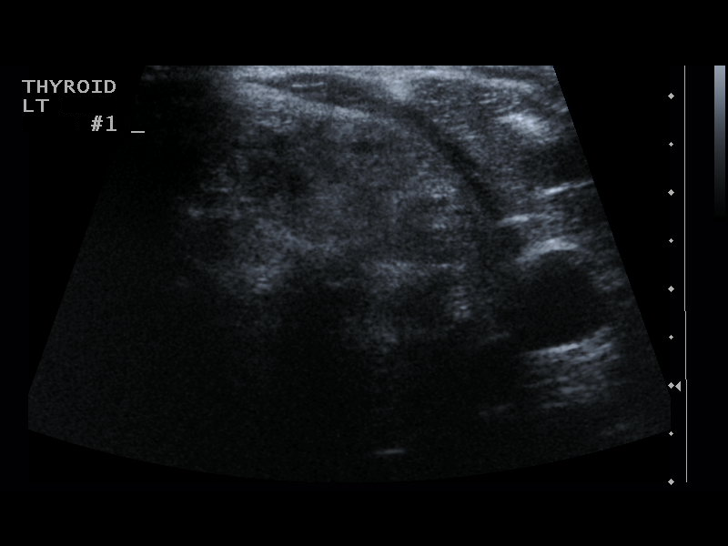
[im 10/18]
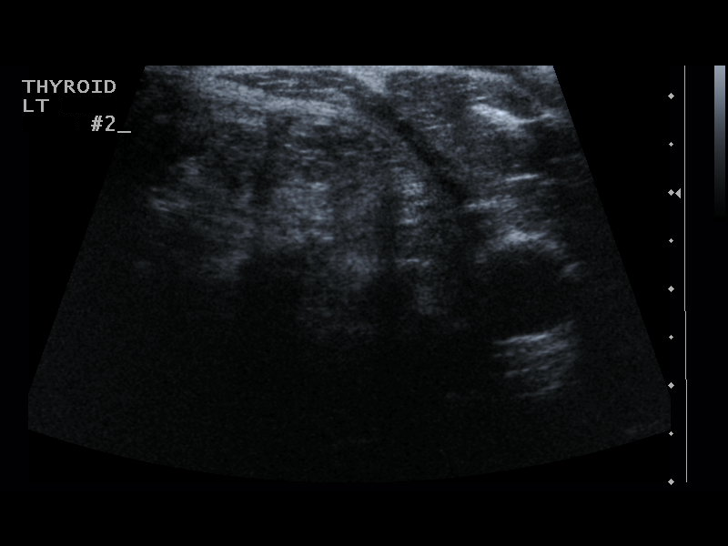
[im 11/18]
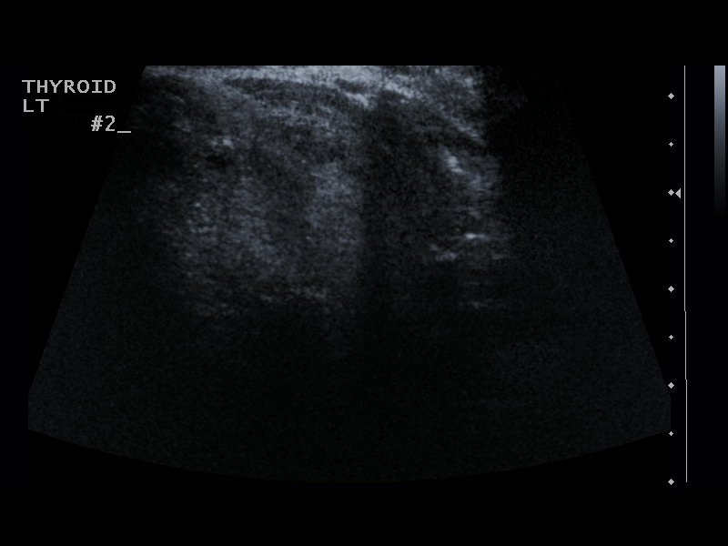
[im 13/18]
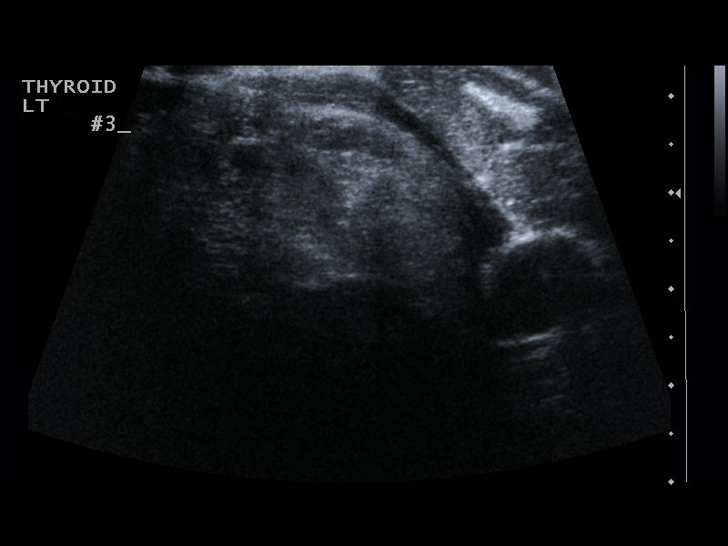
[im 14/18]
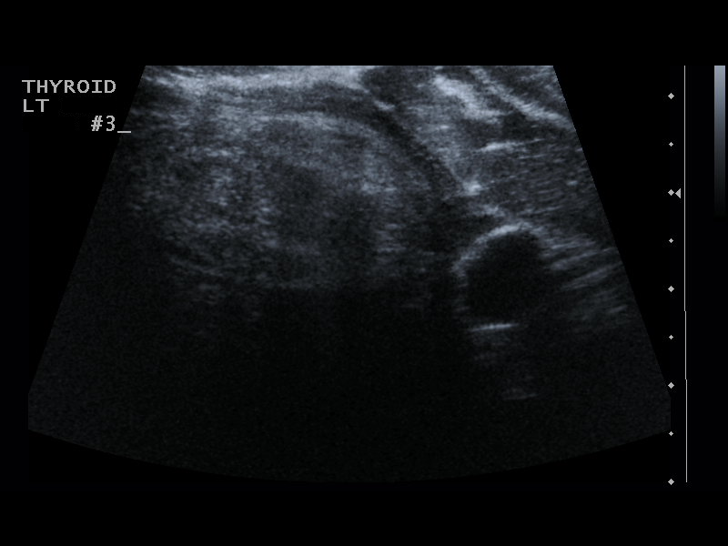
[im 15/18]
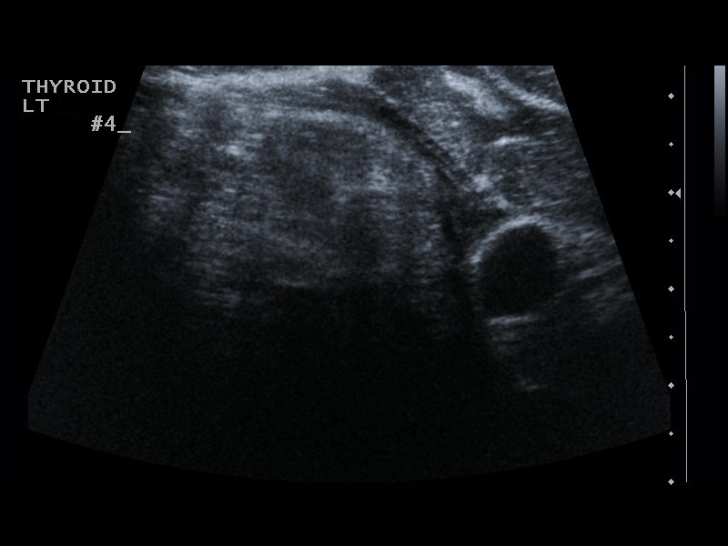
[im 17/18]
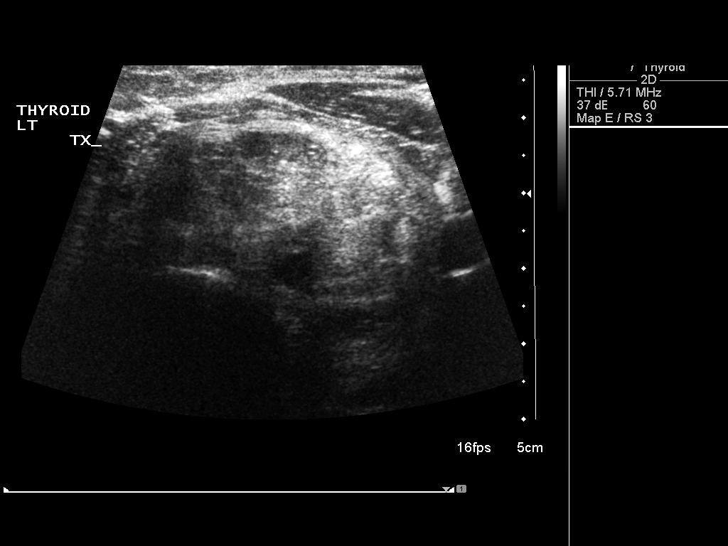
[im 18/18]
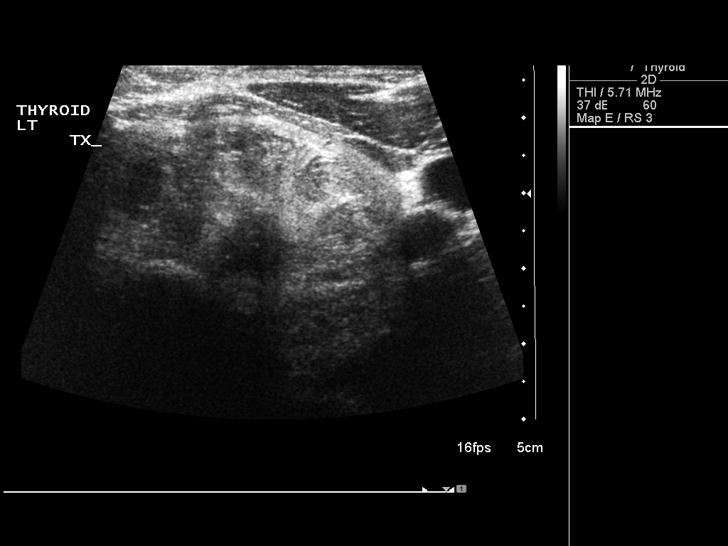

[14 of 18 positions shown; findings below may reference images not displayed]

Survey ultrasound was performed and the dominant lesion in the
inferior left lobe was localized. An appropriate skin entry site was
determined. Skin was marked, then prepped with Betadine, draped in
usual sterile fashion, and infiltrated locally with 1% lidocaine.
Under real-time ultrasound guidance, 4 passes were made into the
lesion with 25 gauge needles. The patient tolerated procedure well.

COMPLICATIONS:
COMPLICATIONS
none
IMPRESSION: 1. Technically successful ultrasound-guided thyroid aspiration
biopsy , dominant inferior left nodule.
